# Patient Record
Sex: Female | Born: 1965 | Race: White | Hispanic: No | Marital: Married | Smoking: Former smoker
Health system: Southern US, Community
[De-identification: ages and names within clinical notes are randomized; demographics above are authoritative.]

## PROBLEM LIST (undated history)

## (undated) DIAGNOSIS — Z87442 Personal history of urinary calculi: Secondary | ICD-10-CM

## (undated) DIAGNOSIS — R519 Headache, unspecified: Secondary | ICD-10-CM

## (undated) DIAGNOSIS — N200 Calculus of kidney: Secondary | ICD-10-CM

## (undated) DIAGNOSIS — IMO0002 Reserved for concepts with insufficient information to code with codable children: Secondary | ICD-10-CM

## (undated) DIAGNOSIS — R51 Headache: Secondary | ICD-10-CM

## (undated) HISTORY — DX: Reserved for concepts with insufficient information to code with codable children: IMO0002

## (undated) HISTORY — DX: Personal history of urinary calculi: Z87.442

---

## 2003-02-09 DIAGNOSIS — IMO0002 Reserved for concepts with insufficient information to code with codable children: Secondary | ICD-10-CM

## 2003-02-09 DIAGNOSIS — R87619 Unspecified abnormal cytological findings in specimens from cervix uteri: Secondary | ICD-10-CM

## 2003-02-09 HISTORY — DX: Unspecified abnormal cytological findings in specimens from cervix uteri: R87.619

## 2003-02-09 HISTORY — DX: Reserved for concepts with insufficient information to code with codable children: IMO0002

## 2006-07-18 ENCOUNTER — Other Ambulatory Visit: Admission: RE | Admit: 2006-07-18 | Discharge: 2006-07-18 | Payer: Self-pay | Admitting: Obstetrics and Gynecology

## 2007-07-20 ENCOUNTER — Other Ambulatory Visit: Admission: RE | Admit: 2007-07-20 | Discharge: 2007-07-20 | Payer: Self-pay | Admitting: Obstetrics & Gynecology

## 2011-06-09 ENCOUNTER — Other Ambulatory Visit: Payer: Self-pay

## 2011-06-09 ENCOUNTER — Ambulatory Visit
Admission: RE | Admit: 2011-06-09 | Discharge: 2011-06-09 | Disposition: A | Discharge status: Home / Self Care | Payer: BC Managed Care – PPO | Source: Ambulatory Visit | Attending: Obstetrics & Gynecology | Admitting: Obstetrics & Gynecology

## 2011-06-09 ENCOUNTER — Other Ambulatory Visit: Payer: Self-pay | Admitting: Obstetrics & Gynecology

## 2011-06-09 DIAGNOSIS — R1032 Left lower quadrant pain: Secondary | ICD-10-CM

## 2011-06-09 MED ORDER — IOHEXOL 300 MG/ML  SOLN
100.0000 mL | Freq: Once | INTRAMUSCULAR | Status: AC | PRN
  Administered 2011-06-09: 100 mL via INTRAVENOUS

## 2012-08-02 ENCOUNTER — Other Ambulatory Visit: Payer: Self-pay | Admitting: Certified Nurse Midwife

## 2012-08-02 NOTE — Telephone Encounter (Signed)
Pt scheduled appt for 08/10/12.

## 2012-08-02 NOTE — Telephone Encounter (Signed)
eScribe request for refill on LORYNA Last filled - 08/10/11 X 1 YEAR Last AEX - 08/10/11 Next AEX - not scheduled.  Pt needs AEX. Message left for pt to return call.  1 month supply sent to pharmacy.

## 2012-08-04 ENCOUNTER — Encounter: Payer: Self-pay | Admitting: Certified Nurse Midwife

## 2012-08-10 ENCOUNTER — Encounter: Payer: Self-pay | Admitting: Certified Nurse Midwife

## 2012-08-10 ENCOUNTER — Ambulatory Visit: Payer: Self-pay | Admitting: Certified Nurse Midwife

## 2012-08-10 ENCOUNTER — Ambulatory Visit (INDEPENDENT_AMBULATORY_CARE_PROVIDER_SITE_OTHER): Payer: BC Managed Care – HMO | Admitting: Certified Nurse Midwife

## 2012-08-10 VITALS — BP 114/68 | HR 72 | Resp 18 | Ht 61.0 in | Wt 137.0 lb

## 2012-08-10 DIAGNOSIS — Z309 Encounter for contraceptive management, unspecified: Secondary | ICD-10-CM

## 2012-08-10 DIAGNOSIS — Z0000 Encounter for general adult medical examination without abnormal findings: Secondary | ICD-10-CM

## 2012-08-10 DIAGNOSIS — Z01419 Encounter for gynecological examination (general) (routine) without abnormal findings: Secondary | ICD-10-CM

## 2012-08-10 DIAGNOSIS — IMO0001 Reserved for inherently not codable concepts without codable children: Secondary | ICD-10-CM

## 2012-08-10 LAB — POCT URINALYSIS DIPSTICK: pH, UA: 5

## 2012-08-10 LAB — HEMOGLOBIN, FINGERSTICK: Hemoglobin, fingerstick: 12.5 g/dL (ref 12.0–16.0)

## 2012-08-10 MED ORDER — DROSPIRENONE-ETHINYL ESTRADIOL 3-0.02 MG PO TABS: 1.0000 | ORAL_TABLET | Freq: Every day | ORAL | Status: DC

## 2012-08-10 NOTE — Patient Instructions (Signed)

## 2012-08-10 NOTE — Progress Notes (Addendum)
47 y.o. G2P2002 married Caucasian Fe here for annual exam. Periods normal, no issues. On OCP for cycle control working well. No health issues today. No more problems with kidney stones. Enjoying being married!!  Patient's last menstrual period was 07/15/2012.          Sexually active: yes  The current method of family planning is OCP (estrogen/progesterone).    Exercising: no   Smoker:  no  Health Maintenance: Pap:  08/10/11 Normal  MMG:  04/06/12 Colonoscopy:  none BMD:   None TDaP:  Not up to date Labs: Hgb: 12.5    Urine: Leuks Trace   reports that she has never smoked. She does not have any smokeless tobacco history on file. She reports that she does not drink alcohol or use illicit drugs.  Past Medical History  Diagnosis Date  . History of kidney stones     10 years with uti's  . 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 Cesarean section  x2    Current Outpatient Prescriptions  Medication Sig Dispense Refill  . Ascorbic Acid (VITAMIN C PO) Take by mouth.      . drospirenone-ethinyl estradiol (LORYNA) 3-0.02 MG tablet TAKE 1 TABLET EVERY DAY  28 tablet  0  . IBUPROFEN PO Take by mouth as needed.      Marland Kitchen MAGNESIUM PO Take by mouth.       No current facility-administered medications for this visit.    Family History  Problem Relation Age of Onset  . Diabetes Father 56    ROS:  Pertinent items are noted in HPI.  Otherwise, a comprehensive ROS was negative.  Exam:   BP 114/68  Pulse 72  Resp 18  Ht 5\' 1"  (1.549 m)  Wt 137 lb (62.143 kg)  BMI 25.9 kg/m2  LMP 07/15/2012 Height: 5\' 1"  (154.9 cm)  Ht Readings from Last 3 Encounters:  08/10/12 5\' 1"  (1.549 m)    General appearance: alert, cooperative and appears stated age Head: Normocephalic, without obvious abnormality, atraumatic Neck: no adenopathy, supple, symmetrical, trachea midline and thyroid normal to inspection and palpation Lungs: clear to auscultation  bilaterally Breasts: normal appearance, no masses or tenderness, No nipple retraction or dimpling, No nipple discharge or bleeding, No axillary or supraclavicular adenopathy Heart: regular rate and rhythm Abdomen: soft, non-tender; no masses,  no organomegaly Extremities: extremities normal, atraumatic, no cyanosis or edema Skin: Skin color, texture, turgor normal. No rashes or lesions Lymph nodes: Cervical, supraclavicular, and axillary nodes normal. No abnormal inguinal nodes palpated Neurologic: Grossly normal   Pelvic: External genitalia:  no lesions              Urethra:  normal appearing urethra with no masses, tenderness or lesions              Bartholin's and Skene's: normal                 Vagina: normal appearing vagina with normal color and discharge, no lesions              Cervix: normal, non tender              Pap taken: no Bimanual Exam:  Uterus:  normal size, contour, position, consistency, mobility, non-tender and anteflexed              Adnexa: normal adnexa and no mass, fullness, tenderness  Rectovaginal: Confirms               Anus:  normal sphincter tone, no lesions  A:  Well Woman with normal exam  Contraception: OCP  History of Kidney stones  History of abnormal pap 2005, per record CIN1  Follow up pap negative  P:   Reviewed health and wellness pertinent to exam  Rx Yaz see order   Pap smear as per guidelines   Mammogram yearly pap smear not taken today  counseled on breast self exam, mammography screening, use and side effects of OCP's, adequate intake of calcium and vitamin D, diet and exercise  return annually or prn  An After Visit Summary was printed and given to the patient.   Review of this patient's previous records shows a history of CIN 1 - no h/o HGSIL.  CPRomine MD

## 2012-08-13 NOTE — Progress Notes (Signed)
CIN 2 in 2005, so I think she needs a pap.

## 2012-08-18 ENCOUNTER — Encounter: Payer: Self-pay | Admitting: Certified Nurse Midwife

## 2012-08-18 NOTE — Progress Notes (Signed)
Deb -  Did you get this pt to come back and get a pap?

## 2013-08-11 ENCOUNTER — Other Ambulatory Visit: Payer: Self-pay | Admitting: Certified Nurse Midwife

## 2013-08-13 NOTE — Telephone Encounter (Signed)
Last AEX 08/10/12 #1/12 refills was sent to pharmacy AEX scheduled for 08/10/13 with Ms. Carolynn Sayers #28/0 refills sent to pharmacy to last patient until AEX

## 2013-08-14 ENCOUNTER — Ambulatory Visit (INDEPENDENT_AMBULATORY_CARE_PROVIDER_SITE_OTHER): Payer: BC Managed Care – HMO | Admitting: Certified Nurse Midwife

## 2013-08-14 ENCOUNTER — Encounter: Payer: Self-pay | Admitting: Certified Nurse Midwife

## 2013-08-14 VITALS — BP 104/60 | HR 64 | Resp 16 | Ht 59.75 in | Wt 139.0 lb

## 2013-08-14 DIAGNOSIS — Z01419 Encounter for gynecological examination (general) (routine) without abnormal findings: Secondary | ICD-10-CM

## 2013-08-14 DIAGNOSIS — Z124 Encounter for screening for malignant neoplasm of cervix: Secondary | ICD-10-CM

## 2013-08-14 DIAGNOSIS — Z309 Encounter for contraceptive management, unspecified: Secondary | ICD-10-CM

## 2013-08-14 MED ORDER — DROSPIRENONE-ETHINYL ESTRADIOL 3-0.02 MG PO TABS: ORAL_TABLET | ORAL | Status: DC

## 2013-08-14 NOTE — Progress Notes (Signed)
48 y.o. G32P2002 Married Caucasian Fe here for annual exam.  Periods normal, 4-5 duration, moderate to light flow, no spotting, no issues with. [place] working well. Has labs at work, all normal per patient. Sees PCP prn problems only. Uneventful year! No health issues today.  Patient's last menstrual period was 08/11/2013.          Sexually active: Yes.    The current method of family planning is OCP (estrogen/progesterone).    Exercising: Yes.    walking Smoker:  no  Health Maintenance: Pap: 08-10-11 neg MMG:  04-06-12 normal per patient records requested Colonoscopy:  none BMD:   none TDaP:  Not sure, pt declines to update Labs: none Self breast exam: done occ   reports that she has quit smoking. She does not have any smokeless tobacco history on file. She reports that she drinks about .5 ounces of alcohol per week. She reports that she does not use illicit drugs.  Past Medical History  Diagnosis Date  . History of kidney stones     10 years with uti's  . Abnormal Pap smear 2005    CIN 1 per records    Past Surgical History  Procedure Laterality Date  . Cesarean section  x2    Current Outpatient Prescriptions  Medication Sig Dispense Refill  . HYDROcodone-acetaminophen (NORCO/VICODIN) 5-325 MG per tablet as needed.      . IBUPROFEN PO Take by mouth as needed.      Marland Kitchen LORYNA 3-0.02 MG tablet TAKE 1 TABLET BY MOUTH DAILY.  28 tablet  0  . ondansetron (ZOFRAN) 4 MG tablet as needed.      . tamsulosin (FLOMAX) 0.4 MG CAPS capsule Take as directed for 10 days       No current facility-administered medications for this visit.    Family History  Problem Relation Age of Onset  . Diabetes Father 40    ROS:  Pertinent items are noted in HPI.  Otherwise, a comprehensive ROS was negative.  Exam:   BP 104/60  Pulse 64  Resp 16  Ht 4' 11.75" (1.518 m)  Wt 139 lb (63.05 kg)  BMI 27.36 kg/m2  LMP 08/11/2013 Height: 4' 11.75" (151.8 cm)  Ht Readings from Last 3 Encounters:   08/14/13 4' 11.75" (1.518 m)  08/10/12 5\' 1"  (1.549 m)    General appearance: alert, cooperative and appears stated age Head: Normocephalic, without obvious abnormality, atraumatic Neck: no adenopathy, supple, symmetrical, trachea midline and thyroid normal to inspection and palpation and non-palpable Lungs: clear to auscultation bilaterally Breasts: normal appearance, no masses or tenderness, No nipple retraction or dimpling, No nipple discharge or bleeding, No axillary or supraclavicular adenopathy Heart: regular rate and rhythm Abdomen: soft, non-tender; no masses,  no organomegaly Extremities: extremities normal, atraumatic, no cyanosis or edema Skin: Skin color, texture, turgor normal. No rashes or lesions Lymph nodes: Cervical, supraclavicular, and axillary nodes normal. No abnormal inguinal nodes palpated Neurologic: Grossly normal   Pelvic: External genitalia:  no lesions              Urethra:  normal appearing urethra with no masses, tenderness or lesions              Bartholin's and Skene's: normal                 Vagina: normal appearing vagina with normal color and discharge, no lesions              Cervix: normal, non tender  Pap taken: Yes.   Bimanual Exam:  Uterus:  normal size, contour, position, consistency, mobility, non-tender and anteverted              Adnexa: normal adnexa and no mass, fullness, tenderness               Rectovaginal: Confirms               Anus:  normal sphincter tone, no lesions  A:  Well Woman with normal exam  Contraception OCP desired  Immunization up date TDAP  P:   Reviewed health and wellness pertinent to exam  Rx Yaz see order  Declines TDAP aware of benefits  Mammogram due stressed importance  Pap smear taken today with HPVHR   counseled on breast self exam, mammography  screening, use and side effects of OCP's, adequate intake of calcium and vitamin D, diet and exercise  return annually or prn  An After Visit  Summary was printed and given to the patient.

## 2013-08-14 NOTE — Patient Instructions (Signed)

## 2013-08-17 LAB — IPS PAP TEST WITH HPV

## 2013-08-17 NOTE — Progress Notes (Signed)
Reviewed personally.  M. Suzanne Kairi Harshbarger, MD.  

## 2013-12-10 ENCOUNTER — Encounter: Payer: Self-pay | Admitting: Certified Nurse Midwife

## 2014-07-25 ENCOUNTER — Other Ambulatory Visit: Payer: Self-pay | Admitting: Urology

## 2014-08-06 ENCOUNTER — Encounter (HOSPITAL_COMMUNITY): Payer: Self-pay | Admitting: General Practice

## 2014-08-09 HISTORY — PX: LITHOTRIPSY: SUR834

## 2014-08-15 ENCOUNTER — Ambulatory Visit (HOSPITAL_COMMUNITY): Payer: BLUE CROSS/BLUE SHIELD

## 2014-08-15 ENCOUNTER — Encounter (HOSPITAL_COMMUNITY)
Admission: RE | Disposition: A | Discharge status: Home / Self Care | Payer: Self-pay | Source: Ambulatory Visit | Attending: Urology

## 2014-08-15 ENCOUNTER — Ambulatory Visit (HOSPITAL_COMMUNITY)
Admission: RE | Admit: 2014-08-15 | Discharge: 2014-08-15 | Disposition: A | Discharge status: Home / Self Care | Payer: BLUE CROSS/BLUE SHIELD | Source: Ambulatory Visit | Attending: Urology | Admitting: Urology

## 2014-08-15 ENCOUNTER — Encounter (HOSPITAL_COMMUNITY): Payer: Self-pay | Admitting: General Practice

## 2014-08-15 DIAGNOSIS — N200 Calculus of kidney: Secondary | ICD-10-CM | POA: Insufficient documentation

## 2014-08-15 DIAGNOSIS — Z79891 Long term (current) use of opiate analgesic: Secondary | ICD-10-CM | POA: Insufficient documentation

## 2014-08-15 DIAGNOSIS — Z87442 Personal history of urinary calculi: Secondary | ICD-10-CM | POA: Diagnosis not present

## 2014-08-15 DIAGNOSIS — N201 Calculus of ureter: Secondary | ICD-10-CM

## 2014-08-15 DIAGNOSIS — Z793 Long term (current) use of hormonal contraceptives: Secondary | ICD-10-CM | POA: Insufficient documentation

## 2014-08-15 DIAGNOSIS — R109 Unspecified abdominal pain: Secondary | ICD-10-CM | POA: Diagnosis present

## 2014-08-15 DIAGNOSIS — Z87891 Personal history of nicotine dependence: Secondary | ICD-10-CM | POA: Diagnosis not present

## 2014-08-15 DIAGNOSIS — Z79899 Other long term (current) drug therapy: Secondary | ICD-10-CM | POA: Insufficient documentation

## 2014-08-15 DIAGNOSIS — Z841 Family history of disorders of kidney and ureter: Secondary | ICD-10-CM | POA: Diagnosis not present

## 2014-08-15 HISTORY — DX: Calculus of kidney: N20.0

## 2014-08-15 LAB — PREGNANCY, URINE: Preg Test, Ur: NEGATIVE

## 2014-08-15 SURGERY — LITHOTRIPSY, ESWL
Anesthesia: LOCAL | Laterality: Right

## 2014-08-15 MED ORDER — SODIUM CHLORIDE 0.9 % IV SOLN
INTRAVENOUS | Status: DC
  Administered 2014-08-15: 07:00:00 via INTRAVENOUS

## 2014-08-15 MED ORDER — TAMSULOSIN HCL 0.4 MG PO CAPS: 0.4000 mg | ORAL_CAPSULE | ORAL | Status: DC

## 2014-08-15 MED ORDER — OXYCODONE HCL 5 MG PO TABS
10.0000 mg | ORAL_TABLET | ORAL | Status: DC | PRN
  Administered 2014-08-15: 10 mg via ORAL
  Filled 2014-08-15: qty 2

## 2014-08-15 MED ORDER — OXYCODONE HCL 10 MG PO TABS: 10.0000 mg | ORAL_TABLET | ORAL | Status: DC | PRN

## 2014-08-15 MED ORDER — DIAZEPAM 5 MG PO TABS
10.0000 mg | ORAL_TABLET | ORAL | Status: AC
  Administered 2014-08-15: 10 mg via ORAL
  Filled 2014-08-15: qty 2

## 2014-08-15 MED ORDER — DIPHENHYDRAMINE HCL 25 MG PO CAPS
25.0000 mg | ORAL_CAPSULE | ORAL | Status: AC
  Administered 2014-08-15: 25 mg via ORAL
  Filled 2014-08-15: qty 1

## 2014-08-15 MED ORDER — CIPROFLOXACIN HCL 500 MG PO TABS
500.0000 mg | ORAL_TABLET | ORAL | Status: AC
  Administered 2014-08-15: 500 mg via ORAL
  Filled 2014-08-15: qty 1

## 2014-08-15 MED ORDER — TAMSULOSIN HCL 0.4 MG PO CAPS
0.4000 mg | ORAL_CAPSULE | Freq: Once | ORAL | Status: AC
  Administered 2014-08-15: 0.4 mg via ORAL
  Filled 2014-08-15: qty 1

## 2014-08-15 NOTE — Discharge Instructions (Signed)

## 2014-08-15 NOTE — H&P (Signed)
  s Klatt had an egg-shaped renal pelvic stone with likely a ball-valve effect with intermittent right flank pain. She underwent lithotripsy about 2 weeks ago. She reports that off and on she can have right flank pain but more right lower quadrant pain. She can get a little bit of nausea. It can be quite significant at times. It comes and goes and is rather intermittent. She is voiding more frequently than usual but has no other symptoms of cystitis.    When I saw Ms Claar on 07/02/2014 I thought she may have a distal residual fragment. I gave her Rapaflo, Vicodin, Phenergan, and a strainer. She was here with a followup KUB.   Review of systems: No change in bowel or neurologic systems.  She still had microscopic hematuria on 07/02/2014.   On microscopy Ms Vanengen still has microscopic hematuria.    Past Medical History Problems  1. Former smoker [phone]) 2. History of kidney stones (H84.696)  Surgical History Problems  1. History of Cesarean Section  Current Meds 1. Birth Control Pill;  Therapy: (Recorded:13May2016) to Recorded 2. Hydrocodone-Acetaminophen 5-325 MG Oral Tablet; Take 1 tablet every 4 - 6 hours as  needed;  Therapy: [id number] to (Evaluate:03Jun2016); Last Rx:24May2016 Ordered 3. Promethazine HCl - 25 MG Oral Tablet; TAKE 1 TABLET EVERY 4 TO 6 HOURS AS  NEEDED FOR NAUSEA;  Therapy: [id number] to (Evaluate:29May2016); Last Rx:24May2016 Ordered  Allergies Medication  1. PredniSONE TABS 2. Zithromax Z-Pak TABS  Family History Problems  1. Family history of Diabetes Mellitus : Father 2. Family history of Diabetes Mellitus : Paternal Aunt 3. Family history of Nephrolithiasis : Mother 4. Family history of Nephrolithiasis : Brother  Social History Problems  1. Alcohol Use 2. Caffeine Use 3. Former smoker [phone] 4. Marital History - Currently Married 5. Occupation:   Fish farm manager   Review of Systems Constitutional, skin, eye, otolaryngeal,  hematologic/lymphatic, cardiovascular, pulmonary, endocrine, gastrointestinal, neurological and psychiatric system(s) were reviewed and pertinent findings if present are noted.  Genitourinary: nocturia.  Musculoskeletal: back pain.    Vitals Vital Signs Blood Pressure: 124 / 71 Temperature: 98.5 F Heart Rate: 90   Physical Exam Constitutional: Well nourished and well developed. No acute distress.  ENT:. The ears and nose are normal in appearance.  Neck: The appearance of the neck is normal and no neck mass is present.  Pulmonary: No respiratory distress and normal respiratory rhythm and effort.  Cardiovascular: Heart rate and rhythm are normal. No peripheral edema.  Abdomen: The abdomen is soft and nontender. No masses are palpated. No CVA tenderness. No hernias are palpable. No hepatosplenomegaly noted.  Lymphatics: The femoral and inguinal nodes are not enlarged or tender.  Skin: Normal skin turgor, no visible rash and no visible skin lesions.  Neuro/Psych:. Mood and affect are appropriate.     We talked about ESWL in detail. Pros, cons, general surgical and anesthetic risks, and other options including watchful waiting and ureteroscopy were discussed. Success and failure rates and need for further/repeat therapy were discussed. Risks were described but not limited to pain, infection, sepsis, and bleeding. The risk of renal and ureteral trauma with short- and long-term sequelae was discussed. The risk of injury to adjacent structures was discussed. The risk of needing a stent post-ESWL was discussed.

## 2014-08-15 NOTE — Op Note (Signed)
See [facility] OP note scanned into chart. Also because of the size, density, location and other factors that cannot be anticipated I feel this will likely be a staged procedure. This fact supersedes any indication in the scanned [facility] operative note to the contrary.  

## 2014-08-20 ENCOUNTER — Telehealth: Payer: Self-pay | Admitting: Certified Nurse Midwife

## 2014-08-20 ENCOUNTER — Ambulatory Visit: Payer: BC Managed Care – HMO | Admitting: Certified Nurse Midwife

## 2014-08-20 NOTE — Telephone Encounter (Signed)
Patient canceled her appointment for today. She had a procedure done for her kidney on Thursday and is still not feeling well.

## 2014-09-07 ENCOUNTER — Other Ambulatory Visit: Payer: Self-pay | Admitting: Certified Nurse Midwife

## 2014-09-09 NOTE — Telephone Encounter (Signed)
Medication refill request: Loryna Last AEX:  08/14/13 with DL Next AEX: 11/08/14 with DL  Last MMG (if hormonal medication request): none Refill authorized: #1/1 rfs  (Routed to Ms. Patty since Ms. Jackelyn Poling is out of the office today).

## 2014-11-08 ENCOUNTER — Encounter: Payer: Self-pay | Admitting: Certified Nurse Midwife

## 2014-11-08 ENCOUNTER — Ambulatory Visit (INDEPENDENT_AMBULATORY_CARE_PROVIDER_SITE_OTHER): Payer: BLUE CROSS/BLUE SHIELD | Admitting: Certified Nurse Midwife

## 2014-11-08 VITALS — BP 120/70 | HR 68 | Resp 16 | Ht 61.0 in | Wt 141.0 lb

## 2014-11-08 DIAGNOSIS — N9489 Other specified conditions associated with female genital organs and menstrual cycle: Secondary | ICD-10-CM

## 2014-11-08 DIAGNOSIS — Z01419 Encounter for gynecological examination (general) (routine) without abnormal findings: Secondary | ICD-10-CM

## 2014-11-08 DIAGNOSIS — N949 Unspecified condition associated with female genital organs and menstrual cycle: Secondary | ICD-10-CM

## 2014-11-08 DIAGNOSIS — N852 Hypertrophy of uterus: Secondary | ICD-10-CM

## 2014-11-08 NOTE — Patient Instructions (Signed)
EXERCISE AND DIET:  We recommended that you start or continue a regular exercise program for good health. Regular exercise means any activity that makes your heart beat faster and makes you sweat.  We recommend exercising at least 30 minutes per day at least 3 days a week, preferably 4 or 5.  We also recommend a diet low in fat and sugar.  Inactivity, poor dietary choices and obesity can cause diabetes, heart attack, stroke, and kidney damage, among others.    ALCOHOL AND SMOKING:  Women should limit their alcohol intake to no more than 7 drinks/beers/glasses of wine (combined, not each!) per week. Moderation of alcohol intake to this level decreases your risk of breast cancer and liver damage. And of course, no recreational drugs are part of a healthy lifestyle.  And absolutely no smoking or even second hand smoke. Most people know smoking can cause heart and lung diseases, but did you know it also contributes to weakening of your bones? Aging of your skin?  Yellowing of your teeth and nails?  CALCIUM AND VITAMIN D:  Adequate intake of calcium and Vitamin D are recommended.  The recommendations for exact amounts of these supplements seem to change often, but generally speaking 600 mg of calcium (either carbonate or citrate) and 800 units of Vitamin D per day seems prudent. Certain women may benefit from higher intake of Vitamin D.  If you are among these women, your doctor will have told you during your visit.    PAP SMEARS:  Pap smears, to check for cervical cancer or precancers,  have traditionally been done yearly, although recent scientific advances have shown that most women can have pap smears less often.  However, every woman still should have a physical exam from her gynecologist every year. It will include a breast check, inspection of the vulva and vagina to check for abnormal growths or skin changes, a visual exam of the cervix, and then an exam to evaluate the size and shape of the uterus and  ovaries.  And after 47 years of age, a rectal exam is indicated to check for rectal cancers. We will also provide age appropriate advice regarding health maintenance, like when you should have certain vaccines, screening for sexually transmitted diseases, bone density testing, colonoscopy, mammograms, etc.   MAMMOGRAMS:  All women over 40 years old should have a yearly mammogram. Many facilities now offer a "3D" mammogram, which may cost around $50 extra out of pocket. If possible,  we recommend you accept the option to have the 3D mammogram performed.  It both reduces the number of women who will be called back for extra views which then turn out to be normal, and it is better than the routine mammogram at detecting truly abnormal areas.    COLONOSCOPY:  Colonoscopy to screen for colon cancer is recommended for all women at age 50.  We know, you hate the idea of the prep.  We agree, BUT, having colon cancer and not knowing it is worse!!  Colon cancer so often starts as a polyp that can be seen and removed at colonscopy, which can quite literally save your life!  And if your first colonoscopy is normal and you have no family history of colon cancer, most women don't have to have it again for 10 years.  Once every ten years, you can do something that may end up saving your life, right?  We will be happy to help you get it scheduled when you are ready.    Be sure to check your insurance coverage so you understand how much it will cost.  It may be covered as a preventative service at no cost, but you should check your particular policy.     Fibroids Fibroids are lumps (tumors) that can occur any place in a woman's body. These lumps are not cancerous. Fibroids vary in size, weight, and where they grow. HOME CARE  Do not take aspirin.  Write down the number of pads or tampons you use during your period. Tell your doctor. This can help determine the best treatment for you. GET HELP RIGHT AWAY IF:  You have  pain in your lower belly (abdomen) that is not helped with medicine.  You have cramps that are not helped with medicine.  You have more bleeding between or during your period.  You feel lightheaded or pass out (faint).  Your lower belly pain gets worse. MAKE SURE YOU:  Understand these instructions.  Will watch your condition.  Will get help right away if you are not doing well or get worse. Document Released: 02/27/2010 Document Revised: 04/19/2011 Document Reviewed: 02/27/2010 [facility] Patient Information 2015 [place], [place]. This information is not intended to replace advice given to you by your health care provider. Make sure you discuss any questions you have with your health care provider.

## 2014-11-08 NOTE — Progress Notes (Signed)
Encounter reviewed by Dr. Brook Amundson C. Silva.  

## 2014-11-08 NOTE — Progress Notes (Addendum)
49 y.o. G5P2002 Married  Caucasian Fe here for annual exam. Periods are normal, no issues. 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 Recent lithotripsy for kidney stone, which has resolved. Sees urgent care if needed. Now a grandmother with first grandson! Has stopped OCP to see if helps with libido. Not worried about contraception. No other health issues today.  Patient's last menstrual period was 10/29/2014.          Sexually active: Yes.    The current method of family planning is OCP (estrogen/progesterone).    Exercising: Yes.    walking Smoker:  no  Health Maintenance: Pap:  08-14-13 neg HPV HR neg MMG:  04-06-12 neg Colonoscopy:  none BMD:   none TDaP:  2016 Labs: none Self breast exam: done occ   reports that she has quit smoking. She does not have any smokeless tobacco history on file. She reports that she drinks about 1.2 oz of alcohol per week. She reports that she does not use illicit drugs.  Past Medical History  Diagnosis Date  . History of kidney stones     10 years with uti's  . Abnormal Pap smear 2005    CIN 1 per records  . Kidney stones     Past Surgical History  Procedure Laterality Date  . Cesarean section  x2    Current Outpatient Prescriptions  Medication Sig Dispense Refill  . cyclobenzaprine (FLEXERIL) 5 MG tablet Take 5 mg by mouth.    Marland Kitchen HYDROcodone-acetaminophen (NORCO/VICODIN) 5-325 MG per tablet 1-2 tablets every 4 (four) hours as needed for moderate pain.     Marland Kitchen LORYNA 3-0.02 MG tablet TAKE 1 TABLET BY MOUTH DAILY. 1 Package 1  . Multiple Vitamin (MULTIVITAMIN WITH MINERALS) TABS tablet Take 1 tablet by mouth every evening.     No current facility-administered medications for this visit.    Family History  Problem Relation Age of Onset  . Diabetes Father 31    ROS:  Pertinent items are noted in HPI.  Otherwise, a comprehensive ROS was negative.  Exam:   BP 120/70 mmHg  Pulse 68  Resp 16  Ht 5\' 1"  (1.549 m)   Wt 141 lb (63.957 kg)  BMI 26.66 kg/m2  LMP 10/29/2014 Height: 5\' 1"  (154.9 cm) Ht Readings from Last 3 Encounters:  11/08/14 5\' 1"  (1.549 m)  08/15/14 5\' 1"  (1.549 m)  08/14/13 4' 11.75" (1.518 m)    General appearance: alert, cooperative and appears stated age Head: Normocephalic, without obvious abnormality, atraumatic Neck: no adenopathy, supple, symmetrical, trachea midline and thyroid normal to inspection and palpation Lungs: clear to auscultation bilaterally Breasts: normal appearance, no masses or tenderness, No nipple retraction or dimpling, No nipple discharge or bleeding, No axillary or supraclavicular adenopathy Heart: regular rate and rhythm Abdomen: soft, non-tender; no masses,  no organomegaly Extremities: extremities normal, atraumatic, no cyanosis or edema Skin: Skin color, texture, turgor normal. No rashes or lesions Lymph nodes: Cervical, supraclavicular, and axillary nodes normal. No abnormal inguinal nodes palpated Neurologic: Grossly normal   Pelvic: External genitalia:  no lesions              Urethra:  normal appearing urethra with no masses, tenderness or lesions              Bartholin's and Skene's: normal                 Vagina: normal appearing vagina with normal color and discharge, no lesions  Cervix: normal,non tender, no lesions              Pap taken: No. Bimanual Exam:  Uterus:  enlarged, 10-12 week size tilts left, normal feel weeks size              Adnexa: normal adnexa and no mass, fullness, tenderness               Rectovaginal: Confirms               Anus:  normal sphincter tone, no lesions  Chaperone present: yes  A:  Well Woman with normal exam  Contraception none desired  Enlarged uterus vs adnexal mass on left  Recent lithotripsy for kidney stone, resolving    P:   Reviewed health and wellness pertinent to exam  Will advise if she desires OCP again, no RX given  Discussed finding and need to evaluate with PUS, patient  agreeable and is aware she will be called with insurance info and scheduled. Voiced understanding.questions addressed regarding findings.  Pap smear as above not taken  counseled on breast self exam, mammography screening, adequate intake of calcium and vitamin D, diet and exercise  return annually or prn  An After Visit Summary was printed and given to the patient.

## 2014-11-12 ENCOUNTER — Telehealth: Payer: Self-pay | Admitting: Obstetrics & Gynecology

## 2014-11-12 NOTE — Telephone Encounter (Signed)
Spoke with patient and reviewed benefits for pelvic ultrasound. Patient understood and agreeable. Patient agreeable to schedule with Dr Sabra Heck on 11/14/14. Patient agreeable to 72 hour cancellation policy with $545 fee. Due to scheduling within this window, patient aware she may call today only to cancel/reschedule to avoid fee. Verified arrival date/time. Patient agreeable. Ok to close.

## 2014-11-14 ENCOUNTER — Ambulatory Visit (INDEPENDENT_AMBULATORY_CARE_PROVIDER_SITE_OTHER): Payer: BLUE CROSS/BLUE SHIELD

## 2014-11-14 ENCOUNTER — Ambulatory Visit (INDEPENDENT_AMBULATORY_CARE_PROVIDER_SITE_OTHER): Payer: BLUE CROSS/BLUE SHIELD | Admitting: Obstetrics & Gynecology

## 2014-11-14 VITALS — BP 122/82 | HR 72 | Resp 16 | Wt 144.0 lb

## 2014-11-14 DIAGNOSIS — D251 Intramural leiomyoma of uterus: Secondary | ICD-10-CM | POA: Diagnosis not present

## 2014-11-14 DIAGNOSIS — N949 Unspecified condition associated with female genital organs and menstrual cycle: Secondary | ICD-10-CM | POA: Diagnosis not present

## 2014-11-14 DIAGNOSIS — N9489 Other specified conditions associated with female genital organs and menstrual cycle: Secondary | ICD-10-CM

## 2014-11-14 DIAGNOSIS — N852 Hypertrophy of uterus: Secondary | ICD-10-CM | POA: Diagnosis not present

## 2014-11-14 MED ORDER — NORETHINDRONE 0.35 MG PO TABS: 1.0000 | ORAL_TABLET | Freq: Every day | ORAL | Status: DC

## 2014-11-14 NOTE — Progress Notes (Addendum)
49 y.o. Amy Curry here for a pelvic ultrasound due to enlarged uterus noted on physical exam done with CNM, Amy Curry, on 11/08/14.  Pt 's cycles are normal and she is on [place] for contraception.  Patient's last menstrual period was 10/29/2014.  Sexually active:  yes  Contraception:combination OCPs  FINDINGS: UTERUS: 11.1 x 5.5 x 4.8cm with 3.0 x 1.7 x 2.6cm intramural fibroid and 1.0 x 0.6cm intramural fibroid EMS: 2.75mm ADNEXA:   Left ovary 2.7 x 1.5 x 6.2EZ with 6.6QH follicle   Right ovary not clearly seen but no mass/cystic area notes.  Visualization attempted with both TV and TA approach CUL DE SAC: no free fluid  Incomplete bladder emptying noted on PUS.  Pt voided right before PUS and there was still moderate urine present.  Do not feel size of uterus or size of fibroids is likely to cause incomplete emptying due to location and size.  Pt does have follow up with Amy Curry due to nephrolithiasis and recent lithotripsy.  D/w pt finding with bladder and asked her to convey this to Amy Curry.  Copy of note will be sent.    Findings discussed with pt.  Prior ultrasound reviewed.  Finding of fibroids discussed.  ACOG bulletin on fibroids reviewed together.  Pt would like to try change in OCPs from combination to progesterone only to see if this will decrease size of fibroids/uterus as well as control her cycles.  Side effects of progesterone only OCPs reviewed with pt including DUB, moodiness.  Voiced understanding.  Assessment:  Fibroid uterus with two intramural fibroids Incomplete bladder emptying  Plan: Stop Loryna OCPs and start Micronor.  Pt will finish current pack and then start the micronor.  Will see if this will help to prevent fibroids/uterus from changing in size.  Follow up 4 months for recheck.  Repeat PUS if she has change in symptoms, bleeding in particular, or any change in physical exam.    Pt will follow up with Amy Curry, urology and  copy of note will be sent.   20 minutes spent with patient >50% of time was in face to face discussion of above.

## 2014-11-21 ENCOUNTER — Encounter: Payer: Self-pay | Admitting: Obstetrics & Gynecology

## 2014-11-21 DIAGNOSIS — D251 Intramural leiomyoma of uterus: Secondary | ICD-10-CM | POA: Insufficient documentation

## 2014-11-22 NOTE — Addendum Note (Signed)
Addended by: Regina Eck on: 11/22/2014 08:03 AM   Modules accepted: Miquel Dunn

## 2014-12-02 ENCOUNTER — Ambulatory Visit (INDEPENDENT_AMBULATORY_CARE_PROVIDER_SITE_OTHER): Payer: BLUE CROSS/BLUE SHIELD | Admitting: Obstetrics & Gynecology

## 2014-12-02 ENCOUNTER — Telehealth: Payer: Self-pay | Admitting: Obstetrics & Gynecology

## 2014-12-02 VITALS — BP 144/82 | HR 84 | Resp 20 | Wt 143.0 lb

## 2014-12-02 DIAGNOSIS — N200 Calculus of kidney: Secondary | ICD-10-CM

## 2014-12-02 DIAGNOSIS — R1032 Left lower quadrant pain: Secondary | ICD-10-CM

## 2014-12-02 LAB — POCT URINALYSIS DIPSTICK
BILIRUBIN UA: NEGATIVE
Glucose, UA: NEGATIVE
Ketones, UA: NEGATIVE
Leukocytes, UA: NEGATIVE
NITRITE UA: NEGATIVE
Protein, UA: NEGATIVE
UROBILINOGEN UA: NEGATIVE
pH, UA: 6

## 2014-12-02 MED ORDER — OXYCODONE-ACETAMINOPHEN 5-325 MG PO TABS: 1.0000 | ORAL_TABLET | Freq: Four times a day (QID) | ORAL | Status: DC | PRN

## 2014-12-02 NOTE — Progress Notes (Signed)
Subjective:     Patient ID: Amy Curry, female   DOB: Aug 06, 1965, 49 y.o.   MRN: [id number]  HPI 49 yo G2P2 MWF here for new onset of LLQ pain.  Pt reports cycle started 11/29/14 with heavier flow on Friday and Saturday.  She then started having lower left sided pain starting Saturday afternoon.  Pain is colicky but certain movements seem to make this worse.  Pain is eased off if she sits.  Heating pad while on her left side would help pain as well.  Pain seems to be worse if she's on her right side.  She had some "left over flexeril" and she took this last night.  It did nothing.  Ibuprofen helped mildly.    Denies diarrhea.  Reports she is having "normal bowel movements", about 1 a day.  This hasn't changed over the weekend.  Denies nausea.    Known hx of renal stones.  Pt reports pain is usually on her right side and not her left side.    Pt has PUS 11/14/14.  Two intramural fibroids measuring 3.0 and 1.0cm noted.  Left ovary was normal with a small follicle present.  Right ovary was not seen despite significant attempt to identify.  These were not seen in prior PUS done several years ago.  She as not started the micronor yet.    Review of Systems  Genitourinary: Positive for pelvic pain.       Objective:   Physical Exam  Constitutional: She appears well-developed and well-nourished.  Abdominal: Soft. Bowel sounds are normal. She exhibits no distension and no mass. There is no tenderness. There is no rebound and no guarding.  Genitourinary: Vagina normal. There is no rash, tenderness, lesion or injury on the right labia. There is no rash, tenderness, lesion or injury on the left labia.  Lymphadenopathy:       Right: No inguinal adenopathy present.       Left: No inguinal adenopathy present.  Skin: Skin is warm and dry.  Psychiatric: She has a normal mood and affect.   Brief bedside ultrasound performed.  No gross left adnexal abnormalities noted.    Assessment:     LLQ pain,  cannot illicit pain on physical exam today  H/o nephrolithiasis    Plan:     Pt has follow up with Dr. Matilde Sprang tomorrow.  Pt will call back after appt to give update.  Follow up with be scheduled pending this result.

## 2014-12-02 NOTE — Telephone Encounter (Signed)
Patient called and said, "I am having some really bad pain in my right side where my ovaries are. I want to speak with the nurse about whether it may be my fibroid or not."

## 2014-12-02 NOTE — Telephone Encounter (Signed)
Call to patient. She states that she started her cycle on 11/29/14 and began micronor that day. On Saturday, she developed new pain on the left lower pelvic area. Patient confirms pain is on Left side.  She describes the pain as sharp and shooting and sometimes relieved by rest. Has used heating pad and Excedrin and muscle relaxants  without relief. Denies heavy bleeding. At work today. Patient offered office visit and accepts, office visit with Dr. Sabra Heck today at 1430.  Routing to provider for final review. Patient agreeable to disposition. Will close encounter.

## 2014-12-03 ENCOUNTER — Telehealth: Payer: Self-pay | Admitting: Obstetrics & Gynecology

## 2014-12-03 NOTE — Telephone Encounter (Signed)
Patient had visit with Dr. Matilde Sprang from [facility], she said Dr. Sabra Heck wanted an update. Patient says that Dr. Matilde Sprang will be sending over records and that it would be better for Dr. Sabra Heck to see how she was doing from those. Best contact # for patient [phone]

## 2014-12-03 NOTE — Telephone Encounter (Signed)
Spoke with patient. Patient states that she was seen with Dr.MacDiarmid today and he was going to send [facility] notes of her appointment. She had an X-ray today which did not show a kidney stone. Is being treated like she has a stone to see if one passes that was not seen in the x-ray. If symptoms persist she will be seen again with Dr.MacDiarmid for a CT scan. Advised I will provide Dr.Miller with an update and check for OV notes from Dr.MacDiarmid for Dr.Miller's review.  Routing to provider for final review. Patient agreeable to disposition. Will close encounter.

## 2014-12-06 ENCOUNTER — Encounter: Payer: Self-pay | Admitting: Obstetrics & Gynecology

## 2014-12-06 DIAGNOSIS — N200 Calculus of kidney: Secondary | ICD-10-CM | POA: Insufficient documentation

## 2014-12-27 ENCOUNTER — Telehealth: Payer: Self-pay | Admitting: Obstetrics & Gynecology

## 2014-12-27 NOTE — Telephone Encounter (Signed)
Called pt and spoke with her personally.  She wants to stop her Camilla completely and not restart the combination OCPs as well.  Knows to use condoms for [facility].  She'd just like to see what her cycles are like off all hormones.  She will give an update in another month or if starts to have really have flow.  Knows she has fibroids and may have heavier flow.  Just doesn't like the hormonal side effects on the combination OCPs and is tired of the irregular bleeding on the micronor.    Asked pt about the pain she had at her last visit.  She saw her urologist, Dr. Matilde Sprang.  She was given flomax to help with possibly passing a stone.  The pain resolved in about two days.  She's feeling fine from this standpoint.    Ok to close encounter.

## 2014-12-27 NOTE — Telephone Encounter (Signed)
Patient has been prescribed a new birth control pill which she has been on for a month, she has had two cycles that last 7-10 days within a month. She just started her second pack she's been taking it everyday as instructed. Patient wanting to know Dr. Ammie Ferrier recommendations because she said she can't do this everyday. [phone]

## 2014-12-27 NOTE — Telephone Encounter (Addendum)
Spoke with patient. Patient states that she started taking Camila on the first day of her cycle this month. Her cycle lasted for 10 days. Then had 2 weeks off from bleeding and started again. Has now been bleeding for 7 days. Advised patient with new start on POP it can take 3-6 months for her body to adjust and irregular bleeding is not uncommon. Denies fatigue, weakness,  or dizziness. Encouraged patient to continue taking POP daily at the same time and not to miss any pills. Patient states "I can not do this for 3 more months. I feel like I am on a constant cycle. I have cramping and everything." Patient is requesting I speak with Dr.Miller about alternatives. Advised will speak with Dr.Miller and return call with further recommendations.

## 2015-03-18 ENCOUNTER — Ambulatory Visit: Payer: BLUE CROSS/BLUE SHIELD | Admitting: Obstetrics & Gynecology

## 2015-03-21 ENCOUNTER — Encounter: Payer: Self-pay | Admitting: Obstetrics & Gynecology

## 2015-03-21 ENCOUNTER — Ambulatory Visit (INDEPENDENT_AMBULATORY_CARE_PROVIDER_SITE_OTHER): Payer: BLUE CROSS/BLUE SHIELD | Admitting: Obstetrics & Gynecology

## 2015-03-21 VITALS — BP 110/66 | HR 90 | Resp 16 | Ht 61.0 in | Wt 149.0 lb

## 2015-03-21 DIAGNOSIS — D251 Intramural leiomyoma of uterus: Secondary | ICD-10-CM

## 2015-03-21 DIAGNOSIS — R1032 Left lower quadrant pain: Secondary | ICD-10-CM | POA: Diagnosis not present

## 2015-03-21 NOTE — Progress Notes (Signed)
Subjective:     Patient ID: Amy Curry, female   DOB: 08-Aug-1965, 50 y.o.   MRN: [id number]  HPI 50 yo G2P2 MWF here for follow-up of irregular and heavy cycles, enlarged uterus and known fibroid.  Seen in October for PUS showing enlarged uterus measuring 11 x 5.5 x 5cm with 3.0 and 1.0cm fibroids.  Pt was seen a couple of weeks later, she returned for complaint of LLQ pain.  She followed up with Dr. Matilde Sprang and he thought she may have had a kidney stone.  Was treated with Flomax.  Cycles are "fairly regular" occuring every 3-4 weeks with flow really heavy on day 1 and lasts, in total, 5-6 days.    Pt has not had the same pain she had in October.  Only once, in January, did she have a little twinge on the same side but this lasted just a few hours and resolved after she took a "muscle relaxer" she had left over from treatment for a muscle spasm in the past.  She's not really sure it did anything or if the pain just resolved on it's own.  For now, she is not interested in anything hormonal for treatment and I am in agreement.  Known hx of fibroids but unless bleeding changes, she just wants to "ride this out until menopause, if possible".    Review of Systems  All other systems reviewed and are negative.      Objective:   Physical Exam  Constitutional: She is oriented to person, place, and time. She appears well-developed and well-nourished.  Neurological: She is alert and oriented to person, place, and time.  Psychiatric: She has a normal mood and affect.   No other physical exam performed    Assessment:     H/o LLQ pain that has resolved Known hx of fibroids.  Stopped combination OCPs due to size of uterus and ultrasound findings. H/O DUB that seems to have resolved after stopping Micronor H/O nephrolithiasis No using any oral contraception    Plan:     Pt aware for need to use condoms or natural family planning for contraception For now, will just keep appt scheduled for  AEX 10/17 with Debbi Hollice Espy and call if she has any new changes/issues with bleeding.      ~15 minutes spent with patient >50% of time was in face to face discussion of above.

## 2015-11-11 ENCOUNTER — Ambulatory Visit (INDEPENDENT_AMBULATORY_CARE_PROVIDER_SITE_OTHER): Payer: BLUE CROSS/BLUE SHIELD | Admitting: Certified Nurse Midwife

## 2015-11-11 ENCOUNTER — Encounter: Payer: Self-pay | Admitting: Certified Nurse Midwife

## 2015-11-11 VITALS — BP 116/62 | HR 70 | Resp 16 | Ht 60.75 in | Wt 147.0 lb

## 2015-11-11 DIAGNOSIS — N852 Hypertrophy of uterus: Secondary | ICD-10-CM

## 2015-11-11 DIAGNOSIS — Z01419 Encounter for gynecological examination (general) (routine) without abnormal findings: Secondary | ICD-10-CM | POA: Diagnosis not present

## 2015-11-11 DIAGNOSIS — Z86018 Personal history of other benign neoplasm: Secondary | ICD-10-CM

## 2015-11-11 DIAGNOSIS — Z124 Encounter for screening for malignant neoplasm of cervix: Secondary | ICD-10-CM | POA: Diagnosis not present

## 2015-11-11 NOTE — Patient Instructions (Signed)

## 2015-11-11 NOTE — Progress Notes (Signed)
50 y.o. G30P2002 Married  Caucasian Fe here for annual exam. Periods are worse with increase clotting , 21 day cycle with 3-4 days duration with heavy day 1-2. Has had accidents. Cramping is severe on first day and then decreases. OTC does not help. History of fibroids, aware of heavy periods with fibroids. "Just tired of dealing with this". Contraception none in years. Declines labs today. No other health issues today. First grandchild a grandson!  Patient's last menstrual period was 11/08/2015 (exact date).          Sexually active: Yes.    The current method of family planning is none.  Exercising: No.  exercise Smoker:  no  Health Maintenance: Pap: 08-14-13 neg HPV HR neg MMG:  12-11-14 birads 2:neg Colonoscopy:  none BMD:   none TDaP:  2016 Shingles: no Pneumonia: no Hep C and HIV: not done Labs: none Self breast exam: done weekly   reports that she has quit smoking. She has never used smokeless tobacco. She reports that she drinks about 0.6 oz of alcohol per week . She reports that she does not use drugs.  Past Medical History:  Diagnosis Date  . 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 History of kidney stones    10 years with uti's  . Kidney stones     Past Surgical History:  Procedure Laterality Date  . CESAREAN SECTION  x2  . LITHOTRIPSY  7/16    Current Outpatient Prescriptions  Medication Sig Dispense Refill  . Multiple Vitamin (MULTIVITAMIN WITH MINERALS) TABS tablet Take 1 tablet by mouth every evening.     No current facility-administered medications for this visit.     Family History  Problem Relation Age of Onset  . Diabetes Father 66    ROS:  Pertinent items are noted in HPI.  Otherwise, a comprehensive ROS was negative.  Exam:   BP 116/62   Pulse 70   Resp 16   Ht 5' 0.75" (1.543 m)   Wt 147 lb (66.7 kg)   LMP 11/08/2015 (Exact Date)   BMI 28.00 kg/m  Height: 5' 0.75" (154.3 cm) Ht Readings from Last 3 Encounters:  11/11/15 5' 0.75"  (1.543 m)  03/21/15 5\' 1"  (1.549 m)  11/08/14 5\' 1"  (1.549 m)    General appearance: alert, cooperative and appears stated age Head: Normocephalic, without obvious abnormality, atraumatic Neck: no adenopathy, supple, symmetrical, trachea midline and thyroid normal to inspection and palpation Lungs: clear to auscultation bilaterally Breasts: normal appearance, no masses or tenderness, No nipple retraction or dimpling, No nipple discharge or bleeding, No axillary or supraclavicular adenopathy Heart: regular rate and rhythm Abdomen: soft, non-tender; no masses,  no organomegaly Extremities: extremities normal, atraumatic, no cyanosis or edema Skin: Skin color, texture, turgor normal. No rashes or lesions Lymph nodes: Cervical, supraclavicular, and axillary nodes normal. No abnormal inguinal nodes palpated Neurologic: Grossly normal   Pelvic: External genitalia:  no lesions              Urethra:  normal appearing urethra with no masses, tenderness or lesions              Bartholin's and Skene's: normal                 Vagina: normal appearing vagina with normal color and discharge, no lesions              Cervix: multiparous appearance, no cervical motion tenderness and no lesions  Pap taken: Yes.   Bimanual Exam:  Uterus:  enlarged, 12-14 week size, nodular tilts to left weeks size              Adnexa: normal adnexa and no mass, fullness, tenderness               Rectovaginal: Confirms               Anus:  normal sphincter tone, no lesions  Chaperone present: yes  A:  Well Woman with normal exam  Contraception none  Dysmenorrhea/menorrhagia with regular cycle  History of fibroids ? Size change  Screening labs needed declines  P:   Reviewed health and wellness pertinent to exam  Discussed fibroids and menstrual cycle changes with. Discussed options for management , medication, ? Ablation, hysterectomy. Patient is ready to move on for better life without periods.  Will  discuss options available to her with Dr. Sabra Heck and advise or set up consult. Patient agreeable  May need repeat PUS to evaluate size of uterus and fibroids. Patient agreeable if needed.  Pap smear as above with HPV reflex   counseled on breast self exam, mammography screening, adequate intake of calcium and vitamin D, diet and exercise  return annually or prn  An After Visit Summary was printed and given to the patient.

## 2015-11-12 LAB — IPS PAP TEST WITH REFLEX TO HPV

## 2015-11-12 NOTE — Progress Notes (Signed)
Encounter reviewed Aerilynn Goin, MD   

## 2015-11-14 ENCOUNTER — Telehealth: Payer: Self-pay | Admitting: Certified Nurse Midwife

## 2015-11-14 DIAGNOSIS — N852 Hypertrophy of uterus: Secondary | ICD-10-CM

## 2015-11-14 DIAGNOSIS — D251 Intramural leiomyoma of uterus: Secondary | ICD-10-CM

## 2015-11-14 NOTE — Telephone Encounter (Signed)
PUS would be good to comparison and then a consultation/exam with me.

## 2015-11-14 NOTE — Telephone Encounter (Signed)
Patient was seen this week and has some questions for DL.

## 2015-11-14 NOTE — Telephone Encounter (Signed)
Spoke with patient. Patient was seen on 10/3/207 for aex with Melvia Heaps CNM. Reports they discussed possible ablation versus hysterectomy. Patient has a history of fibroids and is having increased clotting and heavy bleeding with her menses. Patient states she has decided that she does not want an ablation and would like to have a hysterectomy at this time. Advised I will let Melvia Heaps CNM know and will review with Dr.Miller regarding next steps. Patient is agreeable.  Dr.Miller, would you like to see this patient for a PUS to evaluate the size of her uterus and fibroids? Patient's last PUS was performed on 11/14/2014 which showed enlarged uterus measuring 11 x 5.5 x 5cm with 3.0 and 1.0cm fibroids.

## 2015-11-17 NOTE — Telephone Encounter (Signed)
Left message to call Kaitlyn at [phone]. 

## 2015-11-17 NOTE — Telephone Encounter (Signed)
Spoke with patient. Advised of message as seen below from [facility]. Patient is agreeable and verbalizes understanding. PUS appointment scheduled for 11/27/2015 at 2:30 pm with 3 pm consult with Dr.Miller. Order for PUS placed for precert.  Routing to provider for final review. Patient agreeable to disposition. Will close encounter.

## 2015-11-27 ENCOUNTER — Encounter: Payer: Self-pay | Admitting: Obstetrics & Gynecology

## 2015-11-27 ENCOUNTER — Ambulatory Visit (INDEPENDENT_AMBULATORY_CARE_PROVIDER_SITE_OTHER): Payer: BLUE CROSS/BLUE SHIELD | Admitting: Obstetrics & Gynecology

## 2015-11-27 ENCOUNTER — Ambulatory Visit (INDEPENDENT_AMBULATORY_CARE_PROVIDER_SITE_OTHER): Payer: BLUE CROSS/BLUE SHIELD

## 2015-11-27 VITALS — BP 110/78 | HR 80 | Resp 16 | Wt 148.0 lb

## 2015-11-27 DIAGNOSIS — N852 Hypertrophy of uterus: Secondary | ICD-10-CM

## 2015-11-27 DIAGNOSIS — N920 Excessive and frequent menstruation with regular cycle: Secondary | ICD-10-CM | POA: Diagnosis not present

## 2015-11-27 DIAGNOSIS — D251 Intramural leiomyoma of uterus: Secondary | ICD-10-CM

## 2015-11-27 NOTE — Progress Notes (Signed)
GYNECOLOGY  VISIT   HPI: 50 y.o. G99P2002 Married Caucasian female with complaint of worsening vaginal bleeding.  Reports she in [place] in July and had an episode of bleeding that soaked through every thing and got on the floor of the bathroom.  Reports she was so embarassed about the bleeding.  Cycles are still monthly, lasting 4-5 days.  On the heaviest days, she is using pads and tampons and can change every half hour to hour.  She does pass clots, largest can be quarter sized.   She has been on OCPs and micronor in the past without significant change in symptoms.  She wants to discuss options today.  [place], myomectomy, progsterone IUD use, endometrial ablation and hysterectomy discussed.  Procedures, recovery, risks generally discussed.  Pt states she never wants to see another drop of blood again so she is leaning more towards a hysterectomy.  Needed to get some more information, which she's gotten today.  [facility] hysterectomy brochure given.  Ultrasound done today as well. Uterus:  10 x 6 x 5cm with fibroids 1.4cm, 1.4cm, and 1.6cm (no significant change from prior PUS) Enodmetrium:  40mm with possible posterior encroachment of fibroid Left ovary:  2.2 x 2.2 x 1.4cm Right ovary 2.1 x 1.5 x 1.0cm [place] sac: negative for free fluid   GYNECOLOGIC HISTORY: Patient's last menstrual period was 11/08/2015 (exact date). Contraception: none Menopausal hormone therapy: none  Patient Active Problem List   Diagnosis Date Noted  . Nephrolithiasis 12/06/2014  . Fibroids, intramural 11/21/2014    Past Medical History:  Diagnosis Date  . 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 History of kidney stones    10 years with uti's  . Kidney stones     Past Surgical History:  Procedure Laterality Date  . CESAREAN SECTION  x2  . LITHOTRIPSY  7/16    MEDS:  Reviewed in EPIC and UTD  ALLERGIES: Review of patient's allergies indicates no known allergies.  Family History  Problem  Relation Age of Onset  . Diabetes Father 16    SH:  Married, non smoker  Review of Systems  Genitourinary:       Menorrhagia  All other systems reviewed and are negative.   PHYSICAL EXAMINATION:    BP 110/78 (BP Location: Right Arm, Patient Position: Sitting, Cuff Size: Normal)   Pulse 80   Resp 16   Wt 148 lb (67.1 kg)   LMP 11/08/2015 (Exact Date)   BMI 28.19 kg/m     General appearance: alert, cooperative and appears stated age CV:  Regular rate and rhythm Lungs:  clear to auscultation, no wheezes, rales or rhonchi, symmetric air entry Abdomen: soft, non-tender; bowel sounds normal; no masses,  no organomegaly  Pelvic: External genitalia:  no lesions              Urethra:  normal appearing urethra with no masses, tenderness or lesions              Bartholins and Skenes: normal                 Vagina: normal appearing vagina with normal color and discharge, no lesions              Cervix: no lesions              Bimanual Exam:  Uterus:  enlarged, 10 weeks size, anteflexed, does not feel fixed  Adnexa: no mass, fullness, tenderness              Rectovaginal: No.              Anus:  normal sphincter tone, no lesions  Chaperone was present for exam.  Assessment: Menorrhagia Uterine fibroids H/O cesarean section x 2  Plan: Pt deciding about options.  She will need an endometrial biopsy before proceeding with treatment.  She would like to do this at a follow up visit. Will also plan to check hb as well.  She would like to do this at follow up visit.  She is going to talk with her husband and work and then will call with decision regarding treatment.   ~40 minutes spent with patient >50% of time was in face to face discussion of above.

## 2015-11-29 DIAGNOSIS — N920 Excessive and frequent menstruation with regular cycle: Secondary | ICD-10-CM | POA: Insufficient documentation

## 2015-12-03 ENCOUNTER — Telehealth: Payer: Self-pay | Admitting: Certified Nurse Midwife

## 2015-12-03 NOTE — Telephone Encounter (Signed)
Patient is calling to speak with [name]. She states she spoke with [facility] yesterday and is waiting to speak with [name].

## 2015-12-04 NOTE — Telephone Encounter (Signed)
Return call from patient. Would like to proceed with 01-13-16 surgery date. Surgery instruction sheet reviewed and printed copy will be mailed to patient.  Surgery scheduled for 01-13-16 at 0730 at [place] to provider for final review. Patient agreeable to disposition. Will close encounter.

## 2015-12-04 NOTE — Telephone Encounter (Signed)
Call to patient. Surgery date options discussed with patient. She desires end of November or first week of December. Advised 11/27 or 12/5 are available. She will discuss with husband and call back.

## 2015-12-04 NOTE — Telephone Encounter (Signed)
Patient is returning a call to Sally. °

## 2015-12-04 NOTE — Telephone Encounter (Signed)
Call back to patient, left message to call back.

## 2015-12-10 ENCOUNTER — Telehealth: Payer: Self-pay | Admitting: Obstetrics & Gynecology

## 2015-12-10 NOTE — Telephone Encounter (Signed)
Patient has questions regarding surgery paperwork.

## 2015-12-10 NOTE — Telephone Encounter (Addendum)
Spoke with patient. Patient states she is scheduled for [facility] 01/13/16. Patient states she has work leave papers that need to be filled out by office. Patient provided with fax [phone]. Patient to fax forms. Advised will receive call from [facility] to review process. Patient verbalizes understanding and is agreeable.   Routing to provider for final review. Patient is agreeable to disposition. Will close encounter.   Cc: Lerry Liner

## 2015-12-30 ENCOUNTER — Ambulatory Visit (INDEPENDENT_AMBULATORY_CARE_PROVIDER_SITE_OTHER): Payer: BLUE CROSS/BLUE SHIELD | Admitting: Obstetrics & Gynecology

## 2015-12-30 ENCOUNTER — Encounter: Payer: Self-pay | Admitting: Obstetrics & Gynecology

## 2015-12-30 VITALS — BP 128/66 | HR 80 | Resp 14 | Ht 60.75 in | Wt 144.0 lb

## 2015-12-30 DIAGNOSIS — D251 Intramural leiomyoma of uterus: Secondary | ICD-10-CM | POA: Diagnosis not present

## 2015-12-30 DIAGNOSIS — N852 Hypertrophy of uterus: Secondary | ICD-10-CM | POA: Diagnosis not present

## 2015-12-30 DIAGNOSIS — N920 Excessive and frequent menstruation with regular cycle: Secondary | ICD-10-CM | POA: Diagnosis not present

## 2015-12-30 DIAGNOSIS — N858 Other specified noninflammatory disorders of uterus: Secondary | ICD-10-CM | POA: Diagnosis not present

## 2015-12-30 MED ORDER — IBUPROFEN 800 MG PO TABS: 800.0000 mg | ORAL_TABLET | Freq: Three times a day (TID) | ORAL | 0 refills | Status: DC | PRN

## 2015-12-30 MED ORDER — OXYCODONE-ACETAMINOPHEN 5-325 MG PO TABS: 2.0000 | ORAL_TABLET | ORAL | 0 refills | Status: DC | PRN

## 2015-12-30 NOTE — Progress Notes (Signed)
50 y.o. G70P2002 Married Caucasian female here for discussion of desires for management of menorrhagia and uterine fibroids.  Pt has experienced worse bleeding over the past year or so.  She had an episode this summer while traveling of socially embarrassing bleeding that has "pushed me over the edge".  She has decided to proceed with definitive management with hysterectomy and is here for discussion of procedure including risks and benefits.  She has been on OCPs without improvement and does not want to use any hormonal method.  Additional options for treatment including ablation, IUD and myomectomy have been discussed.  She just wants to be done with the bleeding.    Last ultrasound performed 11/27/15 showing uterus with fibroids but possible encroachment of fibroid into endometrium may be cause of change in bleeding this year.  Procedure discussed with patient.  Hospital stay, recovery and pain management all discussed.  Risks discussed including but not limited to bleeding, 1% risk of receiving a  transfusion, infection, 3-4% risk of bowel/bladder/ureteral/vascular injury discussed as well as possible need for additional surgery if injury does occur discussed.  DVT/PE and rare risk of death discussed.  My actual complications with prior surgeries discussed.  Vaginal cuff dehiscence discussed.  Hernia formation discussed.  Positioning and incision locations discussed.  Patient aware if pathology abnormal she may need additional treatment.  All questions answered.    Ob Hx:   Patient's last menstrual period was 12/29/2015.          Sexually active: Yes.   Birth control: no method Last pap: 11/11/15 negative Last MMG: 12/11/14 BIRADS 2 benign  Tobacco: Former smoker  Past Surgical History:  Procedure Laterality Date  . CESAREAN SECTION  x2  . LITHOTRIPSY  7/16    Past Medical History:  Diagnosis Date  . 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 History of kidney stones    10 years with  uti's  . Kidney stones     Allergies: Patient has no known allergies.  Current Outpatient Prescriptions  Medication Sig Dispense Refill  . Ascorbic Acid (VITAMIN C PO) Take 1 tablet by mouth every morning.    . Multiple Vitamins-Minerals (ZINC PO) Take 1 tablet by mouth every morning.    . naproxen sodium (ALEVE) 220 MG tablet Take 220 mg by mouth daily as needed (pain).    . niacin 500 MG tablet Take 500 mg by mouth at bedtime.    Marland Kitchen OVER THE COUNTER MEDICATION Place 1 drop into both eyes daily as needed (tired eyes). [employer] Tired Eyes eye drop     No current facility-administered medications for this visit.     ROS: A comprehensive review of systems was negative.  Exam:    BP 128/66 (BP Location: Right Arm, Patient Position: Sitting, Cuff Size: Normal)   Pulse 80   Resp 14   Ht 5' 0.75" (1.543 m)   Wt 144 lb (65.3 kg)   LMP 12/29/2015   BMI 27.43 kg/m   General appearance: alert and cooperative Head: Normocephalic, without obvious abnormality, atraumatic Neck: no adenopathy, supple, symmetrical, trachea midline and thyroid not enlarged, symmetric, no tenderness/mass/nodules Lungs: clear to auscultation bilaterally Heart: regular rate and rhythm, S1, S2 normal, no murmur, click, rub or gallop Abdomen: soft, non-tender; bowel sounds normal; no masses,  no organomegaly Extremities: extremities normal, atraumatic, no cyanosis or edema Skin: Skin color, texture, turgor normal. No rashes or lesions Lymph nodes: Cervical, supraclavicular, and axillary nodes normal. no  inguinal nodes palpated Neurologic: Grossly normal  Pelvic: External genitalia:  no lesions              Urethra: normal appearing urethra with no masses, tenderness or lesions              Bartholins and Skenes: normal                 Vagina: normal appearing vagina with normal color and discharge, no lesions              Cervix: normal appearance              Pap taken: No.        Bimanual Exam:  Uterus:   enlarged to 10 week's size, uterus is globular                                      Adnexa:    normal adnexa in size, nontender and no masses                                      Rectovaginal: Deferred                                      Anus:  normal sphincter tone, no lesions  Endometrial biopsy recommended.  Discussed with patient.  Verbal and written consent obtained.   Procedure:  Speculum placed.  Cervix visualized and cleansed with betadine prep.  A single toothed tenaculum was applied to the anterior lip of the cervix.  Endometrial pipelle was advanced through the cervix into the endometrial cavity without difficulty.  Pipelle passed to 9cm.  Suction applied and pipelle removed with good tissue sample obtained.  Tenculum removed.  No bleeding noted.  Patient tolerated procedure well.   A: Menorrhagia, socially embarrassing at times Enlarged fibroid uterus  H/O cesarean section x 2 H/O nephrolithiasis  P:  TLH/bilateral salpingectomy, possible BSO, cystoscopy planned Rx for Motrin and Percocet given. Medications/Vitamins reviewed.  Hysterectomy brochure given for pre and post op instructions.  ~25 minutes spent with patient >50% of time was in face to face discussion of above.

## 2015-12-30 NOTE — Patient Instructions (Signed)
Your procedure is scheduled on:  Tuesday, Dec. 5, 2017  Enter through the [employer] of [facility] at:  6:00 AM   Pick up the phone at the desk and dial [phone].  Call this number if you have problems the morning of surgery: [phone].  Remember: Do NOT eat food or drink after:  Midnight Monday, Dec. 4, 2017  Take these medicines the morning of surgery with a SIP OF WATER:  None  Stop ALL herbal medications, Vitamin C and Aleve at this time   Do NOT wear jewelry (body piercing), metal hair clips/bobby pins, make-up, or nail polish. Do NOT wear lotions, powders, or perfumes.  You may wear deodorant. Do NOT shave for 48 hours prior to surgery. Do NOT bring valuables to the hospital. Contacts, dentures, or bridgework may not be worn into surgery.  Leave suitcase in car.  After surgery it may be brought to your room.  For patients admitted to the hospital, checkout time is 11:00 AM the day of discharge.

## 2015-12-31 ENCOUNTER — Other Ambulatory Visit: Payer: Self-pay | Admitting: Obstetrics & Gynecology

## 2015-12-31 ENCOUNTER — Encounter (HOSPITAL_COMMUNITY): Payer: Self-pay

## 2015-12-31 ENCOUNTER — Encounter (HOSPITAL_COMMUNITY)
Admission: RE | Admit: 2015-12-31 | Discharge: 2015-12-31 | Disposition: A | Discharge status: Home / Self Care | Payer: BLUE CROSS/BLUE SHIELD | Source: Ambulatory Visit | Attending: Obstetrics & Gynecology | Admitting: Obstetrics & Gynecology

## 2015-12-31 DIAGNOSIS — Z01812 Encounter for preprocedural laboratory examination: Secondary | ICD-10-CM | POA: Diagnosis present

## 2015-12-31 DIAGNOSIS — D259 Leiomyoma of uterus, unspecified: Secondary | ICD-10-CM | POA: Diagnosis not present

## 2015-12-31 DIAGNOSIS — N920 Excessive and frequent menstruation with regular cycle: Secondary | ICD-10-CM | POA: Diagnosis not present

## 2015-12-31 HISTORY — DX: Headache: R51

## 2015-12-31 HISTORY — DX: Headache, unspecified: R51.9

## 2015-12-31 LAB — CBC
HEMATOCRIT: 39.4 % (ref 36.0–46.0)
Hemoglobin: 13.4 g/dL (ref 12.0–15.0)
MCH: 30.5 pg (ref 26.0–34.0)
MCHC: 34 g/dL (ref 30.0–36.0)
MCV: 89.5 fL (ref 78.0–100.0)
Platelets: 272 10*3/uL (ref 150–400)
RBC: 4.4 MIL/uL (ref 3.87–5.11)
RDW: 13.1 % (ref 11.5–15.5)
WBC: 8.2 10*3/uL (ref 4.0–10.5)

## 2016-01-06 ENCOUNTER — Telehealth: Payer: Self-pay

## 2016-01-06 NOTE — Telephone Encounter (Signed)
-----   Message from Megan Salon, MD sent at 01/05/2016  5:13 PM EST ----- Please let pt know her endometrial biopsy was negative for abnormal cells.

## 2016-01-06 NOTE — Telephone Encounter (Signed)
Spoke with patient. Advised of results as seen below from Dr.Miller. Patient is agreeable and verbalizes understanding.   Routing to provider for final review. Patient agreeable to disposition. Will close encounter.  

## 2016-01-12 MED ORDER — DEXTROSE 5 % IV SOLN
2.0000 g | INTRAVENOUS | Status: AC
  Administered 2016-01-13: 2 g via INTRAVENOUS
  Filled 2016-01-12: qty 2

## 2016-01-13 ENCOUNTER — Ambulatory Visit (HOSPITAL_COMMUNITY)
Admission: RE | Admit: 2016-01-13 | Discharge: 2016-01-14 | Disposition: A | Discharge status: Home / Self Care | Payer: BLUE CROSS/BLUE SHIELD | Source: Ambulatory Visit | Attending: Obstetrics & Gynecology | Admitting: Obstetrics & Gynecology

## 2016-01-13 ENCOUNTER — Encounter (HOSPITAL_COMMUNITY)
Admission: RE | Disposition: A | Discharge status: Home / Self Care | Payer: Self-pay | Source: Ambulatory Visit | Attending: Obstetrics & Gynecology

## 2016-01-13 ENCOUNTER — Encounter (HOSPITAL_COMMUNITY): Payer: Self-pay

## 2016-01-13 ENCOUNTER — Ambulatory Visit (HOSPITAL_COMMUNITY): Payer: BLUE CROSS/BLUE SHIELD | Admitting: Anesthesiology

## 2016-01-13 DIAGNOSIS — Z87442 Personal history of urinary calculi: Secondary | ICD-10-CM | POA: Insufficient documentation

## 2016-01-13 DIAGNOSIS — Z833 Family history of diabetes mellitus: Secondary | ICD-10-CM | POA: Diagnosis not present

## 2016-01-13 DIAGNOSIS — N809 Endometriosis, unspecified: Secondary | ICD-10-CM

## 2016-01-13 DIAGNOSIS — N920 Excessive and frequent menstruation with regular cycle: Secondary | ICD-10-CM | POA: Insufficient documentation

## 2016-01-13 DIAGNOSIS — N852 Hypertrophy of uterus: Secondary | ICD-10-CM | POA: Diagnosis not present

## 2016-01-13 DIAGNOSIS — N803 Endometriosis of pelvic peritoneum: Secondary | ICD-10-CM | POA: Diagnosis not present

## 2016-01-13 DIAGNOSIS — Z87891 Personal history of nicotine dependence: Secondary | ICD-10-CM | POA: Insufficient documentation

## 2016-01-13 DIAGNOSIS — D251 Intramural leiomyoma of uterus: Secondary | ICD-10-CM | POA: Diagnosis not present

## 2016-01-13 DIAGNOSIS — N838 Other noninflammatory disorders of ovary, fallopian tube and broad ligament: Secondary | ICD-10-CM | POA: Diagnosis not present

## 2016-01-13 DIAGNOSIS — N736 Female pelvic peritoneal adhesions (postinfective): Secondary | ICD-10-CM | POA: Insufficient documentation

## 2016-01-13 DIAGNOSIS — N72 Inflammatory disease of cervix uteri: Secondary | ICD-10-CM | POA: Diagnosis not present

## 2016-01-13 DIAGNOSIS — D250 Submucous leiomyoma of uterus: Secondary | ICD-10-CM | POA: Insufficient documentation

## 2016-01-13 DIAGNOSIS — D259 Leiomyoma of uterus, unspecified: Secondary | ICD-10-CM | POA: Diagnosis not present

## 2016-01-13 HISTORY — PX: CYSTOSCOPY: SHX5120

## 2016-01-13 HISTORY — PX: LAPAROSCOPIC BILATERAL SALPINGECTOMY: SHX5889

## 2016-01-13 HISTORY — PX: LAPAROSCOPIC LYSIS OF ADHESIONS: SHX5905

## 2016-01-13 HISTORY — PX: LAPAROSCOPIC HYSTERECTOMY: SHX1926

## 2016-01-13 LAB — PREGNANCY, URINE: PREG TEST UR: NEGATIVE

## 2016-01-13 LAB — HEMOGLOBIN: HEMOGLOBIN: 12 g/dL (ref 12.0–15.0)

## 2016-01-13 SURGERY — HYSTERECTOMY, TOTAL, LAPAROSCOPIC
Anesthesia: General | Site: Urethra

## 2016-01-13 MED ORDER — OXYCODONE-ACETAMINOPHEN 5-325 MG PO TABS
1.0000 | ORAL_TABLET | ORAL | Status: DC | PRN
  Administered 2016-01-14: 2 via ORAL
  Administered 2016-01-14: 1 via ORAL
  Filled 2016-01-13: qty 1
  Filled 2016-01-13: qty 2

## 2016-01-13 MED ORDER — HYDROMORPHONE HCL 1 MG/ML IJ SOLN
0.2500 mg | INTRAMUSCULAR | Status: DC | PRN
  Administered 2016-01-13 (×2): 0.5 mg via INTRAVENOUS

## 2016-01-13 MED ORDER — BUPIVACAINE HCL (PF) 0.25 % IJ SOLN
INTRAMUSCULAR | Status: AC
  Filled 2016-01-13: qty 30

## 2016-01-13 MED ORDER — FENTANYL CITRATE (PF) 100 MCG/2ML IJ SOLN
INTRAMUSCULAR | Status: DC | PRN
  Administered 2016-01-13 (×5): 50 ug via INTRAVENOUS

## 2016-01-13 MED ORDER — MEPERIDINE HCL 25 MG/ML IJ SOLN: 6.2500 mg | INTRAMUSCULAR | Status: DC | PRN

## 2016-01-13 MED ORDER — DEXAMETHASONE SODIUM PHOSPHATE 10 MG/ML IJ SOLN
INTRAMUSCULAR | Status: DC | PRN
  Administered 2016-01-13: 4 mg via INTRAVENOUS

## 2016-01-13 MED ORDER — ONDANSETRON HCL 4 MG/2ML IJ SOLN
INTRAMUSCULAR | Status: AC
  Filled 2016-01-13: qty 2

## 2016-01-13 MED ORDER — SIMETHICONE 80 MG PO CHEW: 80.0000 mg | CHEWABLE_TABLET | Freq: Four times a day (QID) | ORAL | Status: DC | PRN

## 2016-01-13 MED ORDER — SCOPOLAMINE 1 MG/3DAYS TD PT72
MEDICATED_PATCH | TRANSDERMAL | Status: AC
  Administered 2016-01-13: 1.5 mg via TRANSDERMAL
  Filled 2016-01-13: qty 1

## 2016-01-13 MED ORDER — MIDAZOLAM HCL 2 MG/2ML IJ SOLN
INTRAMUSCULAR | Status: AC
  Filled 2016-01-13: qty 2

## 2016-01-13 MED ORDER — LIDOCAINE HCL (CARDIAC) 20 MG/ML IV SOLN
INTRAVENOUS | Status: AC
  Filled 2016-01-13: qty 5

## 2016-01-13 MED ORDER — SODIUM CHLORIDE 0.9 % IJ SOLN
INTRAMUSCULAR | Status: AC
  Filled 2016-01-13: qty 50

## 2016-01-13 MED ORDER — MIDAZOLAM HCL 2 MG/2ML IJ SOLN
INTRAMUSCULAR | Status: DC | PRN
Start: 2016-01-13 — End: 2016-01-13
  Administered 2016-01-13: 1 mg via INTRAVENOUS

## 2016-01-13 MED ORDER — PROPOFOL 10 MG/ML IV BOLUS
INTRAVENOUS | Status: AC
  Filled 2016-01-13: qty 20

## 2016-01-13 MED ORDER — ACETAMINOPHEN 10 MG/ML IV SOLN
1000.0000 mg | Freq: Once | INTRAVENOUS | Status: DC
  Filled 2016-01-13: qty 100

## 2016-01-13 MED ORDER — KETOROLAC TROMETHAMINE 30 MG/ML IJ SOLN: 30.0000 mg | Freq: Four times a day (QID) | INTRAMUSCULAR | Status: DC

## 2016-01-13 MED ORDER — BUPIVACAINE HCL (PF) 0.25 % IJ SOLN
INTRAMUSCULAR | Status: DC | PRN
  Administered 2016-01-13: 9 mL

## 2016-01-13 MED ORDER — HYDROMORPHONE HCL 1 MG/ML IJ SOLN
INTRAMUSCULAR | Status: AC
  Filled 2016-01-13: qty 1

## 2016-01-13 MED ORDER — PANTOPRAZOLE SODIUM 40 MG IV SOLR
40.0000 mg | Freq: Every day | INTRAVENOUS | Status: DC
  Administered 2016-01-13: 40 mg via INTRAVENOUS
  Filled 2016-01-13: qty 40

## 2016-01-13 MED ORDER — ENOXAPARIN SODIUM 40 MG/0.4ML ~~LOC~~ SOLN
40.0000 mg | SUBCUTANEOUS | Status: AC
  Administered 2016-01-13: 40 mg via SUBCUTANEOUS
  Filled 2016-01-13: qty 0.4

## 2016-01-13 MED ORDER — MORPHINE SULFATE (PF) 4 MG/ML IV SOLN
1.0000 mg | INTRAVENOUS | Status: DC | PRN
  Administered 2016-01-13: 2 mg via INTRAVENOUS
  Administered 2016-01-13: 1 mg via INTRAVENOUS
  Administered 2016-01-13: 2 mg via INTRAVENOUS
  Filled 2016-01-13 (×3): qty 1

## 2016-01-13 MED ORDER — ROCURONIUM BROMIDE 100 MG/10ML IV SOLN
INTRAVENOUS | Status: AC
  Filled 2016-01-13: qty 1

## 2016-01-13 MED ORDER — SODIUM CHLORIDE 0.9 % IJ SOLN
INTRAMUSCULAR | Status: DC | PRN
  Administered 2016-01-13: 30 mL via INTRAVENOUS

## 2016-01-13 MED ORDER — DEXAMETHASONE SODIUM PHOSPHATE 4 MG/ML IJ SOLN
INTRAMUSCULAR | Status: AC
Start: 2016-01-13 — End: 2016-01-13
  Filled 2016-01-13: qty 1

## 2016-01-13 MED ORDER — ROPIVACAINE HCL 5 MG/ML IJ SOLN
INTRAMUSCULAR | Status: AC
  Filled 2016-01-13: qty 30

## 2016-01-13 MED ORDER — ALUM & MAG HYDROXIDE-SIMETH 200-200-20 MG/5ML PO SUSP: 30.0000 mL | ORAL | Status: DC | PRN

## 2016-01-13 MED ORDER — ROCURONIUM BROMIDE 100 MG/10ML IV SOLN
INTRAVENOUS | Status: DC | PRN
  Administered 2016-01-13: 50 mg via INTRAVENOUS
  Administered 2016-01-13: 10 mg via INTRAVENOUS
  Administered 2016-01-13: 20 mg via INTRAVENOUS

## 2016-01-13 MED ORDER — LACTATED RINGERS IR SOLN
Status: DC | PRN
  Administered 2016-01-13: 3000 mL

## 2016-01-13 MED ORDER — DEXTROSE-NACL 5-0.45 % IV SOLN
INTRAVENOUS | Status: DC
  Administered 2016-01-13: 13:00:00 via INTRAVENOUS

## 2016-01-13 MED ORDER — METOCLOPRAMIDE HCL 5 MG/ML IJ SOLN: 10.0000 mg | Freq: Once | INTRAMUSCULAR | Status: DC | PRN

## 2016-01-13 MED ORDER — EPHEDRINE 5 MG/ML INJ
INTRAVENOUS | Status: AC
  Filled 2016-01-13: qty 10

## 2016-01-13 MED ORDER — SUGAMMADEX SODIUM 200 MG/2ML IV SOLN
INTRAVENOUS | Status: AC
  Filled 2016-01-13: qty 2

## 2016-01-13 MED ORDER — LIDOCAINE HCL (CARDIAC) 20 MG/ML IV SOLN
INTRAVENOUS | Status: DC | PRN
  Administered 2016-01-13: 70 mg via INTRAVENOUS
  Administered 2016-01-13: 30 mg via INTRAVENOUS

## 2016-01-13 MED ORDER — ONDANSETRON HCL 4 MG/2ML IJ SOLN
INTRAMUSCULAR | Status: DC | PRN
  Administered 2016-01-13: 4 mg via INTRAVENOUS

## 2016-01-13 MED ORDER — ACETAMINOPHEN 10 MG/ML IV SOLN
INTRAVENOUS | Status: DC | PRN
  Administered 2016-01-13: 1000 mg via INTRAVENOUS

## 2016-01-13 MED ORDER — KETOROLAC TROMETHAMINE 30 MG/ML IJ SOLN
INTRAMUSCULAR | Status: AC
  Filled 2016-01-13: qty 1

## 2016-01-13 MED ORDER — SODIUM CHLORIDE 0.9 % IV SOLN
INTRAVENOUS | Status: DC | PRN
  Administered 2016-01-13: 10:00:00

## 2016-01-13 MED ORDER — LACTATED RINGERS IV SOLN
INTRAVENOUS | Status: DC
  Administered 2016-01-13: 08:00:00 via INTRAVENOUS
  Administered 2016-01-13: 125 mL/h via INTRAVENOUS

## 2016-01-13 MED ORDER — MENTHOL 3 MG MT LOZG: 1.0000 | LOZENGE | OROMUCOSAL | Status: DC | PRN

## 2016-01-13 MED ORDER — ACETAMINOPHEN 325 MG PO TABS: 650.0000 mg | ORAL_TABLET | ORAL | Status: DC | PRN

## 2016-01-13 MED ORDER — FENTANYL CITRATE (PF) 250 MCG/5ML IJ SOLN
INTRAMUSCULAR | Status: AC
  Filled 2016-01-13: qty 5

## 2016-01-13 MED ORDER — STERILE WATER FOR IRRIGATION IR SOLN
Status: DC | PRN
  Administered 2016-01-13: 1000 mL

## 2016-01-13 MED ORDER — EPHEDRINE SULFATE 50 MG/ML IJ SOLN
INTRAMUSCULAR | Status: DC | PRN
  Administered 2016-01-13: 15 mg via INTRAVENOUS

## 2016-01-13 MED ORDER — SUGAMMADEX SODIUM 200 MG/2ML IV SOLN
INTRAVENOUS | Status: DC | PRN
  Administered 2016-01-13: 130.6 mg via INTRAVENOUS

## 2016-01-13 MED ORDER — PROPOFOL 10 MG/ML IV BOLUS
INTRAVENOUS | Status: DC | PRN
  Administered 2016-01-13: 180 mg via INTRAVENOUS

## 2016-01-13 MED ORDER — KETOROLAC TROMETHAMINE 30 MG/ML IJ SOLN
30.0000 mg | Freq: Four times a day (QID) | INTRAMUSCULAR | Status: DC
  Administered 2016-01-13 – 2016-01-14 (×4): 30 mg via INTRAVENOUS
  Filled 2016-01-13 (×4): qty 1

## 2016-01-13 MED ORDER — SCOPOLAMINE 1 MG/3DAYS TD PT72
1.0000 | MEDICATED_PATCH | Freq: Once | TRANSDERMAL | Status: DC
  Administered 2016-01-13: 1.5 mg via TRANSDERMAL

## 2016-01-13 SURGICAL SUPPLY — 70 items
ADH SKN CLS APL DERMABOND .7 (GAUZE/BANDAGES/DRESSINGS)
APL SKNCLS STERI-STRIP NONHPOA (GAUZE/BANDAGES/DRESSINGS)
APL SRG 38 LTWT LNG FL B (MISCELLANEOUS)
APPLICATOR ARISTA FLEXITIP XL (MISCELLANEOUS) IMPLANT
BAG SPEC RTRVL LRG 6X4 10 (ENDOMECHANICALS)
BENZOIN TINCTURE PRP APPL 2/3 (GAUZE/BANDAGES/DRESSINGS) IMPLANT
CABLE HIGH FREQUENCY MONO STRZ (ELECTRODE) ×6 IMPLANT
CATH ROBINSON RED A/P 16FR (CATHETERS) IMPLANT
CLOTH BEACON ORANGE TIMEOUT ST (SAFETY) ×6 IMPLANT
COVER BACK TABLE 60X90IN (DRAPES) ×4 IMPLANT
COVER LIGHT HANDLE  1/PK (MISCELLANEOUS) ×1
COVER LIGHT HANDLE 1/PK (MISCELLANEOUS) ×5 IMPLANT
DERMABOND ADVANCED (GAUZE/BANDAGES/DRESSINGS)
DERMABOND ADVANCED .7 DNX12 (GAUZE/BANDAGES/DRESSINGS) IMPLANT
DRSG OPSITE POSTOP 3X4 (GAUZE/BANDAGES/DRESSINGS) IMPLANT
DURAPREP 26ML APPLICATOR (WOUND CARE) ×6 IMPLANT
GLOVE BIOGEL PI IND STRL 7.0 (GLOVE) ×25 IMPLANT
GLOVE BIOGEL PI INDICATOR 7.0 (GLOVE) ×5
GLOVE ECLIPSE 6.5 STRL STRAW (GLOVE) ×18 IMPLANT
GOWN STRL REUS W/TWL LRG LVL3 (GOWN DISPOSABLE) ×24 IMPLANT
HEMOSTAT ARISTA ABSORB 3G PWDR (MISCELLANEOUS) IMPLANT
LIGASURE VESSEL 5MM BLUNT TIP (ELECTROSURGICAL) ×6 IMPLANT
NEEDLE INSUFFLATION 120MM (ENDOMECHANICALS) ×6 IMPLANT
NS IRRIG 1000ML POUR BTL (IV SOLUTION) ×6 IMPLANT
OCCLUDER COLPOPNEUMO (BALLOONS) ×6 IMPLANT
PACK LAPAROSCOPY BASIN (CUSTOM PROCEDURE TRAY) ×6 IMPLANT
PACK TRENDGUARD 450 HYBRID PRO (MISCELLANEOUS) ×1 IMPLANT
PACK TRENDGUARD 600 HYBRD PROC (MISCELLANEOUS) IMPLANT
POUCH LAPAROSCOPIC INSTRUMENT (MISCELLANEOUS) ×4 IMPLANT
POUCH SPECIMEN RETRIEVAL 10MM (ENDOMECHANICALS) IMPLANT
PROTECTOR NERVE ULNAR (MISCELLANEOUS) ×12 IMPLANT
SCISSORS LAP 5X35 DISP (ENDOMECHANICALS) ×6 IMPLANT
SEALER TISSUE G2 CVD JAW 35 (ENDOMECHANICALS) IMPLANT
SEALER TISSUE G2 CVD JAW 45CM (ENDOMECHANICALS)
SET CYSTO W/LG BORE CLAMP LF (SET/KITS/TRAYS/PACK) ×4 IMPLANT
SET IRRIG TUBING LAPAROSCOPIC (IRRIGATION / IRRIGATOR) ×6 IMPLANT
SET TRI-LUMEN FLTR TB AIRSEAL (TUBING) ×6 IMPLANT
SHEARS HARMONIC ACE PLUS 36CM (ENDOMECHANICALS) ×2 IMPLANT
SLEEVE ADV FIXATION 5X100MM (TROCAR) ×6 IMPLANT
SLEEVE XCEL OPT CAN 5 100 (ENDOMECHANICALS) ×6 IMPLANT
SOLUTION ELECTROLUBE (MISCELLANEOUS) ×6 IMPLANT
SPONGE GAUZE 4X4 12PLY STER LF (GAUZE/BANDAGES/DRESSINGS) ×4 IMPLANT
STRIP CLOSURE SKIN 1/2X4 (GAUZE/BANDAGES/DRESSINGS) IMPLANT
SUT VIC AB 0 CT1 27 (SUTURE) ×12
SUT VIC AB 0 CT1 27XBRD ANBCTR (SUTURE) ×10 IMPLANT
SUT VICRYL 0 UR6 27IN ABS (SUTURE) ×2 IMPLANT
SUT VICRYL 4-0 PS2 18IN ABS (SUTURE) ×6 IMPLANT
SUT VLOC 180 0 9IN  GS21 (SUTURE) ×1
SUT VLOC 180 0 9IN GS21 (SUTURE) ×5 IMPLANT
SYR 30ML LL (SYRINGE) IMPLANT
SYR 50ML LL SCALE MARK (SYRINGE) ×12 IMPLANT
SYRINGE 10CC LL (SYRINGE) ×6 IMPLANT
SYSTEM CARTER THOMASON II (TROCAR) IMPLANT
TIP UTERINE 5.1X6CM LAV DISP (MISCELLANEOUS) IMPLANT
TIP UTERINE 6.7X10CM GRN DISP (MISCELLANEOUS) ×2 IMPLANT
TIP UTERINE 6.7X6CM WHT DISP (MISCELLANEOUS) IMPLANT
TIP UTERINE 6.7X8CM BLUE DISP (MISCELLANEOUS) ×2 IMPLANT
TOWEL OR 17X24 6PK STRL BLUE (TOWEL DISPOSABLE) ×12 IMPLANT
TRAY FOLEY CATH SILVER 14FR (SET/KITS/TRAYS/PACK) ×6 IMPLANT
TRENDGUARD 450 HYBRID PRO PACK (MISCELLANEOUS) ×6
TRENDGUARD 600 HYBRID PROC PK (MISCELLANEOUS)
TROCAR ADV FIXATION 5X100MM (TROCAR) ×6 IMPLANT
TROCAR BALLN 12MMX100 BLUNT (TROCAR) IMPLANT
TROCAR PORT AIRSEAL 5X120 (TROCAR) IMPLANT
TROCAR PORT AIRSEAL 8X120 (TROCAR) IMPLANT
TROCAR XCEL NON BLADE 8MM B8LT (ENDOMECHANICALS) ×2 IMPLANT
TROCAR XCEL NON-BLD 11X100MML (ENDOMECHANICALS) IMPLANT
TROCAR XCEL NON-BLD 5MMX100MML (ENDOMECHANICALS) ×6 IMPLANT
WARMER LAPAROSCOPE (MISCELLANEOUS) ×6 IMPLANT
WATER STERILE IRR 1000ML POUR (IV SOLUTION) ×6 IMPLANT

## 2016-01-13 NOTE — Anesthesia Postprocedure Evaluation (Signed)
Anesthesia Post Note  Patient: Amy Curry  Procedure(s) Performed: Procedure(s) (LRB): HYSTERECTOMY TOTAL LAPAROSCOPIC WITH PERITONEAL BIOPSY (N/A) LAPAROSCOPIC BILATERAL SALPINGECTOMY (Bilateral) CYSTOSCOPY (N/A) LAPAROSCOPIC LYSIS OF ADHESIONS  Patient location during evaluation: [facility] Anesthesia Type: General Level of consciousness: awake, awake and alert and oriented Pain management: pain level not controlled (Nurse gave toradol and now giving morphine) Vital Signs Assessment: post-procedure vital signs reviewed and stable Respiratory status: spontaneous breathing and respiratory function stable Cardiovascular status: stable Postop Assessment: no headache, no backache, patient able to bend at knees, no signs of nausea or vomiting and adequate PO intake     Last Vitals:  Vitals:   01/13/16 1127 01/13/16 1230  BP: (!) 102/59 112/62  Pulse: 91 92  Resp:  18  Temp: 36.7 C 36.7 C    Last Pain:  Vitals:   01/13/16 1352  TempSrc:   PainSc: 8    Pain Goal: Patients Stated Pain Goal: 4 (01/13/16 1352)               Lyncoln Maskell

## 2016-01-13 NOTE — Anesthesia Postprocedure Evaluation (Signed)
Anesthesia Post Note  Patient: Amy Curry  Procedure(s) Performed: Procedure(s) (LRB): HYSTERECTOMY TOTAL LAPAROSCOPIC WITH PERITONEAL BIOPSY (N/A) LAPAROSCOPIC BILATERAL SALPINGECTOMY (Bilateral) CYSTOSCOPY (N/A) LAPAROSCOPIC LYSIS OF ADHESIONS  Patient location during evaluation: PACU Anesthesia Type: General Level of consciousness: awake and alert and oriented Pain management: pain level controlled Vital Signs Assessment: post-procedure vital signs reviewed and stable Respiratory status: spontaneous breathing, nonlabored ventilation, respiratory function stable and patient connected to nasal cannula oxygen Cardiovascular status: blood pressure returned to baseline and stable Postop Assessment: no signs of nausea or vomiting Anesthetic complications: no     Last Vitals:  Vitals:   01/13/16 1030 01/13/16 1045  BP: (!) 105/55 109/63  Pulse: 90 (!) 105  Resp: 18 19  Temp:      Last Pain:  Vitals:   01/13/16 1045  TempSrc:   PainSc: 5    Pain Goal: Patients Stated Pain Goal: 4 (01/13/16 1045)               Tama Grosz A.

## 2016-01-13 NOTE — Anesthesia Preprocedure Evaluation (Addendum)
Anesthesia Evaluation  Patient identified by MRN, date of birth, ID band Patient awake    Reviewed: Allergy & Precautions, NPO status , Patient's Chart, lab work & pertinent test results  Airway Mallampati: II       Dental  (+) Teeth Intact, Partial Upper,    Pulmonary former smoker,    Pulmonary exam normal breath sounds clear to auscultation       Cardiovascular negative cardio ROS Normal cardiovascular exam Rhythm:Regular Rate:Normal     Neuro/Psych  Headaches, negative psych ROS   GI/Hepatic negative GI ROS, Neg liver ROS,   Endo/Other  negative endocrine ROS  Renal/GU Renal diseaseHx/o renal calculi  negative genitourinary   Musculoskeletal negative musculoskeletal ROS (+)   Abdominal   Peds  Hematology negative hematology ROS (+)   Anesthesia Other Findings   Reproductive/Obstetrics Menorrhagia Intramural leiomyoma                            Lab Results  Component Value Date   WBC 8.2 12/31/2015   HGB 13.4 12/31/2015   HCT 39.4 12/31/2015   MCV 89.5 12/31/2015   PLT 272 12/31/2015    Anesthesia Physical Anesthesia Plan  ASA: II  Anesthesia Plan: General   Post-op Pain Management:    Induction:   Airway Management Planned: Oral ETT  Additional Equipment:   Intra-op Plan:   Post-operative Plan: Extubation in OR  Informed Consent: I have reviewed the patients History and Physical, chart, labs and discussed the procedure including the risks, benefits and alternatives for the proposed anesthesia with the patient or authorized representative who has indicated his/her understanding and acceptance.   Dental advisory given  Plan Discussed with: Surgeon, Anesthesiologist and CRNA  Anesthesia Plan Comments:         Anesthesia Quick Evaluation

## 2016-01-13 NOTE — Transfer of Care (Signed)
Immediate Anesthesia Transfer of Care Note  Patient: Amy Curry  Procedure(s) Performed: Procedure(s): HYSTERECTOMY TOTAL LAPAROSCOPIC WITH PERITONEAL BIOPSY (N/A) LAPAROSCOPIC BILATERAL SALPINGECTOMY (Bilateral) CYSTOSCOPY (N/A) LAPAROSCOPIC LYSIS OF ADHESIONS  Patient Location: PACU  Anesthesia Type:General  Level of Consciousness: awake, alert , oriented and patient cooperative  Airway & Oxygen Therapy: Patient Spontanous Breathing and Patient connected to nasal cannula oxygen  Post-op Assessment: Report given to RN and Post -op Vital signs reviewed and stable  Post vital signs: Reviewed and stable  Last Vitals:  Vitals:   01/13/16 0603  BP: 123/77  Pulse: 89  Resp: 16  Temp: 36.9 C    Last Pain:  Vitals:   01/13/16 0603  TempSrc: Oral      Patients Stated Pain Goal: 4 ([id number])  Complications: No apparent anesthesia complications

## 2016-01-13 NOTE — Addendum Note (Signed)
Addendum  created 01/13/16 1406 by Elenore Paddy, CRNA   Sign clinical note

## 2016-01-13 NOTE — H&P (Signed)
Amy Curry is an 50 y.o. female  G2P2 MWF here for definitive management of menorrhagia and uterine fibroids.  Pre op endometrial biopsy was negative for abnormal cells.  Uterus measure 10 x 6 x 5 with several fibroids.  She has been counseled about risks and benefits as well as alternatives and is ready to proceed.  She does desire to have ovarian preservation.  Pertinent Gynecological History: Menses: heavy Contraception: none DES exposure: denies Blood transfusions: none Sexually transmitted diseases: no past history Previous GYN Procedures: none  Last mammogram: normal Date: 11/16 Last pap: normal Date: 11/17 OB History: G2, P2   Menstrual History: Patient's last menstrual period was 12/29/2015 (exact date).    Past Medical History:  Diagnosis Date  . 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 Headache    Migraines  . History of kidney stones    10 years with uti's  . Kidney stones     Past Surgical History:  Procedure Laterality Date  . CESAREAN SECTION  x2  . LITHOTRIPSY  7/16    Family History  Problem Relation Age of Onset  . Diabetes Father 63    Social History:  reports that she has quit smoking. She has never used smokeless tobacco. She reports that she drinks about 0.6 oz of alcohol per week . She reports that she does not use drugs.  Allergies: No Known Allergies  Prescriptions Prior to Admission  Medication Sig Dispense Refill Last Dose  . Ascorbic Acid (VITAMIN C PO) Take 1 tablet by mouth every morning.   Past Month at Unknown time  . ibuprofen (ADVIL,MOTRIN) 800 MG tablet Take 1 tablet (800 mg total) by mouth every 8 (eight) hours as needed. 30 tablet 0 Past Month at Unknown time  . Multiple Vitamins-Minerals (ZINC PO) Take 1 tablet by mouth every morning.   Past Month at Unknown time  . naproxen sodium (ALEVE) 220 MG tablet Take 220 mg by mouth daily as needed (pain).   Past Month at Unknown time  . niacin 500 MG tablet Take 500 mg by mouth at  bedtime.   01/12/2016 at Unknown time  . OVER THE COUNTER MEDICATION Place 1 drop into both eyes daily as needed (tired eyes). [employer] Tired Eyes eye drop   01/12/2016 at Unknown time  . oxyCODONE-acetaminophen (PERCOCET/ROXICET) 5-325 MG tablet Take 2 tablets by mouth every 4 (four) hours as needed for severe pain. 30 tablet 0 More than a month at Unknown time    Review of Systems  All other systems reviewed and are negative.   Blood pressure 123/77, pulse 89, temperature 98.4 F (36.9 C), temperature source Oral, resp. rate 16, last menstrual period 12/29/2015, SpO2 100 %. Physical Exam  Constitutional: She is oriented to person, place, and time. She appears well-developed and well-nourished.  Cardiovascular: Normal rate and regular rhythm.   Respiratory: Effort normal.  GI: Soft. Bowel sounds are normal.  Neurological: She is alert and oriented to person, place, and time.  Skin: Skin is warm and dry.  Psychiatric: She has a normal mood and affect.    Results for orders placed or performed during the hospital encounter of 01/13/16 (from the past 24 hour(s))  Pregnancy, urine     Status: None   Collection Time: 01/13/16  6:00 AM  Result Value Ref Range   Preg Test, Ur NEGATIVE NEGATIVE    No results found.  Assessment/Plan: 50 yo G2P2 MWF with menorrhagia and uterine fibroids  her for definitive treatment with TLH/bilateral salpingectomy, possible BSO, cystoscopy.  All questions answered.  Pt here and ready to proceed.  [name] 01/13/2016, 7:14 AM

## 2016-01-13 NOTE — Progress Notes (Signed)
Day of Surgery Procedure(s) (LRB): HYSTERECTOMY TOTAL LAPAROSCOPIC WITH PERITONEAL BIOPSY (N/A) LAPAROSCOPIC BILATERAL SALPINGECTOMY (Bilateral) CYSTOSCOPY (N/A) LAPAROSCOPIC LYSIS OF ADHESIONS  Subjective: Patient reports adequate pain control.  She has no nausea.  She has voided.  She has walked without difficulty.    Objective: I have reviewed patient's vital signs, intake and output, medications and labs.  General: alert and cooperative Resp: clear to auscultation bilaterally Cardio: regular rate and rhythm, S1, S2 normal, no murmur, click, rub or gallop GI: soft, non-tender; bowel sounds normal; no masses,  no organomegaly and incision: clean, dry and intact Extremities: extremities normal, atraumatic, no cyanosis or edema Vaginal Bleeding: minimal  Assessment: s/p Procedure(s): HYSTERECTOMY TOTAL LAPAROSCOPIC WITH PERITONEAL BIOPSY (N/A) LAPAROSCOPIC BILATERAL SALPINGECTOMY (Bilateral) CYSTOSCOPY (N/A) LAPAROSCOPIC LYSIS OF ADHESIONS: stable and progressing well  Plan: Advance diet Encourage ambulation  LOS: 0 days    [name] 01/13/2016, 6:30 PM

## 2016-01-13 NOTE — Anesthesia Procedure Notes (Signed)
Procedure Name: Intubation Date/Time: 01/13/2016 7:33 AM Performed by: Tobin Chad Pre-anesthesia Checklist: Patient identified, Emergency Drugs available, Suction available and Patient being monitored Patient Re-evaluated:Patient Re-evaluated prior to inductionOxygen Delivery Method: Simple face mask Preoxygenation: Pre-oxygenation with 100% oxygen Intubation Type: IV induction Ventilation: Mask ventilation without difficulty Laryngoscope Size: Mac and 3 Grade View: Grade II Tube type: Oral Tube size: 7.0 mm Number of attempts: 1 Airway Equipment and Method: Stylet Placement Confirmation: ETT inserted through vocal cords under direct vision,  positive ETCO2 and breath sounds checked- equal and bilateral Secured at: 21 (com) cm Tube secured with: Tape Dental Injury: Teeth and Oropharynx as per pre-operative assessment

## 2016-01-13 NOTE — Op Note (Signed)
01/13/2016  10:10 AM  PATIENT:  Amy Curry  50 y.o. female  PRE-OPERATIVE DIAGNOSIS:  menorrhagia, fibroids, endometriosis, significant pelvic adhesions with uterus completely adhered to the anterior abdominal wall and left side wall  POST-OPERATIVE DIAGNOSIS:  menorrhagia, fibroids, endometriosis, significant pelvic adhesions with uterus completely adhered to the anterior abdominal wall and left side wall  PROCEDURE:  Procedure(s): HYSTERECTOMY TOTAL LAPAROSCOPIC WITH PERITONEAL BIOPSY LAPAROSCOPIC BILATERAL SALPINGECTOMY CYSTOSCOPY LAPAROSCOPIC LYSIS OF ADHESIONS  SURGEON:  Winfred Redel SUZANNE  ASSISTANTS: Sumner Boast, MD   ANESTHESIA:   general  ESTIMATED BLOOD LOSS: 75cc  BLOOD ADMINISTERED:none   FLUIDS: 1400cc LR  UOP: 475cc clear uop  SPECIMEN:  Uterus, cervix, bilateral fallopian tubes, peritoneal biopsy  DISPOSITION OF SPECIMEN:  PATHOLOGY  FINDINGS: normal ovaries, area of endometriosis on peritoneum on left side of pelvis with several other small areas in same region, significant adhesions of uterus to anterior abdominal wall above area of prior cesarean section and along the left sidewall, normal posterior cul de sac  DESCRIPTION OF OPERATION: Patient is taken to the operating room. She is placed in the supine position. She is a running IV in place. Informed consent was present on the chart. SCDs on her lower extremities and functioning properly.  pt was positioned and arms tucked by her sides before anesthesia was administered.  Then general endotracheal anesthesia was administered by the anesthesia staff without difficulty. Dr. Royce Macadamia oversaw case. Once adequate anesthesia was confirmed, time out was performed.    Dura prep was then used to prep the abdomen and Betadine was used to prep the inner thighs, perineum and vagina. Once 3 minutes had past the patient was draped in a normal standard fashion. The legs were lifted to the high lithotomy position. The  cervix was visualized by placing a heavy weighted speculum in the posterior aspect of the vagina and using a curved Deaver retractor to the retract anteriorly. The anterior lip of the cervix was grasped with single-tooth tenaculum.  The cervix sounded to 11 cm. Pratt dilators were used to dilate the cervix up to a #21. A RUMI uterine manipulator was obtained. A #10 disposable tip was placed on the RUMI manipulator as well as a small, silver KOH ring.  It was very difficult to pass this through the cervix due to the angle of the uterus.  It felt as thought the uterus was adhered to the abdominal wall and pulled to the left.  Decision made to place abdominal instruments at this point.  The umbilicus was everted.  A Veress needle was obtained. Syringe of sterile saline was placed on a open Veress needle.  This was passed into the umbilicus until just when the fluid started to drip.  Then low flow CO2 gas was attached the needle and the pneumoperitoneum was achieved without difficulty. Once four liters of gas was in the abdomen the Veress needle was removed and a 5 millimeter non-bladed Optiview trocar and port were passed directly to the abdomen. The laparoscope was then used to confirm intraperitoneal placement. The uterus was indeed adhered to the anterior abdominal wall and to the left side wall.  There was some endometriosis appearing lesions on the left peritoneum and left side wall.  Ovaries were normal.  Locations for RLQ and LLQ ports were noted by transillumination of the abdominal wall.  0.25% marcaine was used to anesthetize the skin.  77mm skin incisions were made and then 67mm bladed ports were placed.  Finally a midline incision was  made about 4 cm above the pubic symphysis.  The skin was incised about 1cm and a non-bladed 8 Airseal port was placed with direct visualization of the laparoscope. Ureters were identified.     Foley was placed to SD at this point.  Then using the Harmonic scalpel, the  adhesions of the uterus to the anterior abdominal wall were taken down keeping very close to the anterior abdominal wall.  This gave enough mobility of the uterus for me to feel like I could place the RUMI uterine manipulator.  Attention was turned back to the vagina.  The heavy weighted speculum and Deaver retractor were placed back into the vagina and the anterior lip of the cervix was grasped with a single toothed tenaculum.  The RUMI uterine manipulator was then passed through the cervix and the bulb of the disposable tip was inflated with 10 cc of normal saline. There was a good fit of the KOH ring around the cervix. The tenaculum was removed. There is also good manipulation of the uterus. The speculum and retractor were removed as well.  Legs were lowered to the low lithotomy position and attention was turned the abdomen.  Attention was turned to the right side. With uterus on stretch the right tube was excised off the ovary and mesosalpinx was dissected to free the tube. Then the right utero-ovarian pedicle was serially clamped cauterized and incised using the ligasure device.  Right round ligament was serially clamped cauterized and incised. The anterior and posterior peritoneum of the inferior leaf of the broad ligament were opened. The beginning of the bladder flap was created.  Due to the adhesions on the left side, this dissection could not be taken very far across so attention was turned to the left side.    The uterus was placed on stretch to the opposite side.  The tube was excised off the ovary using sharp dissection a bipolar cautery.  The mesosalpinx was incised freeing the tube. Then the left uterine ovarian pedicle was serially clamped cauterized and incised. Next the left round ligament was serially clamped cauterized and incised. The posterior peritoneum of the inferior leaf of the was opened.   With careful dissection, the adhesions to the left sidewall were dissected slowly, being  sure of all locations of pelvic anatomy.  The bladder was inflated with sterile water to identify the bladder.  Once the bladder could be fully seen, dissection was continued.  Eventually, the uterus was fully freed from the side wall.  Then the anterior peritoneum was incised and the bladder flap was created.  Once the bladder was well below the level of the KOH ring, the left uterine artery was skeletonized.  Then the left uterine artery, above the level of the KOH ring, was serially clamped cauterized and incised.  Next the right uterine artery skeletonized and then just superior to the KOH ring this vessel was serially clamped, cauterized, and incised.  The uterus was devascularized at this point.  This dissection took an additional 45 minutes in comparison to a typical similar surgery without the adhesions.    The colpotomy was performed a starting in the midline and using a harmonic scalpel with the inferior edge of the open blade  This was carried around a circumferential fashion until the vaginal mucosa was completely incised in the specimen was freed.  The specimen was then delivered to the vagina.  A vaginal occlusive device was used to maintain the pneumoperitoneum  Instruments were changed with a  needle driver and million dollar graspers.  Using a 9 inch V. lock suture, the cuff was closed by incorporating the anterior and posterior vaginal mucosa in each stitch. This was carried across all the way to the left corner and a running fashion. Two stitches were brought back towards the midline and the suture was cut flush with the vagina. The needle was brought out the pelvis. The pelvis was irrigated. All pedicles were inspected. No bleeding was noted. In Interceed was placed across vaginal cuff. Ureters were noted deep in the pelvis to be peristalsing.    At this point, the larger area of endometriosis was removed by retracting the peritoneum and making a small incision with the harmonic scalpel.   Then the peritoneum was incised to remove the specimen.  The other smaller areas were cauterized without difficulty.  At this point the procedure was completed.  Inferior ports were removed.  Pneumoperitoneum was relived.  Midline port was removed.  As all ports were 8 and smaller, fascial closure was not needed.    The skin was then closed with subcuticular stitches of 3-0 Vicryl. The skin was cleansed Dermabond was applied. Attention was then turned the vagina and the cuff was inspected. No bleeding was noted. The anterior posterior vaginal mucosa was incorporated in each stitch. The Foley catheter was removed.  Cystoscopy was performed.  No sutures or bladder injuries were noted.  Ureters were noted with normal urine jets from each one was seen.  Foley was replaced and the cystoscopic fluid was drained.  Sponge, lap, needle, initially counts were correct x2. Patient tolerated the procedure very well. She was awakened from anesthesia, extubated and taken to recovery in stable condition.   COUNTS:  YES  PLAN OF CARE: Transfer to PACU

## 2016-01-14 ENCOUNTER — Encounter (HOSPITAL_COMMUNITY): Payer: Self-pay | Admitting: Obstetrics & Gynecology

## 2016-01-14 DIAGNOSIS — N920 Excessive and frequent menstruation with regular cycle: Secondary | ICD-10-CM | POA: Diagnosis not present

## 2016-01-14 DIAGNOSIS — N809 Endometriosis, unspecified: Secondary | ICD-10-CM

## 2016-01-14 MED ORDER — NAPROXEN SODIUM 220 MG PO TABS: 220.0000 mg | ORAL_TABLET | Freq: Every day | ORAL | Status: DC | PRN

## 2016-01-14 NOTE — Discharge Summary (Signed)
Physician Discharge Summary  Patient ID: Amy Curry MRN: [id number] DOB/AGE: 1965-06-21 50 y.o.  Admit date: 01/13/2016 Discharge date: 01/14/2016  Admission Diagnoses: menorrhagia, fibroids, LLQ pain  Discharge Diagnoses:  Active Problems:   Endometriosis   Discharged Condition: good  Hospital Course: Patient admitted through same day surgery.  She was taken to OR where TLH/Bilateral salpingectomy, cystoscopy, LOA were performed.  Surgical findings included significant adhesions to the left sidewall and left anterior abdominal wall of the uterus.  Surgery was uneventful.  EBL 75cc.  Foley catheter was removed before leaving OR.  Patient transferred to PACU where she was stable and then to 3rd floor for the remainder of her hospitalization.  During her post-op recovery, her vitals and stable and she was AF.  In evening of POD#0, she was able to transition to oral pain medications and regular diet.  She was able to ambulate and she had good pain control.  She was also able to void on her own.  Patient seen both in the evening of POD#0 and AM of POD#1.  In the AM of POD#1, she was without complaint.  Post op hb was 12.0, decreased from 13.4, pre-operatively.  At this point, patient was voiding, walking, having excellent pain control, had no nausea, and minimal vaginal bleeding.  She was ready for D/C.  Consults: None  Significant Diagnostic Studies: labs: post op hb 12.0  Treatments: surgery: TLH, bilateral salpingectomy, cystoscopy, LOA  Discharge Exam: Blood pressure 104/62, pulse 82, temperature 98.6 F (37 C), temperature source Oral, resp. rate 16, height 5\' 1"  (1.549 m), weight 144 lb (65.3 kg), last menstrual period 12/29/2015, SpO2 100 %. General appearance: alert and cooperative Resp: clear to auscultation bilaterally Cardio: regular rate and rhythm, S1, S2 normal, no murmur, click, rub or gallop GI: soft, non-tender; bowel sounds normal; no masses,  no  organomegaly Extremities: extremities normal, atraumatic, no cyanosis or edema Incision/Wound:c/d/i  Disposition: 01-Home or Self Care     Medication List    TAKE these medications   ibuprofen 800 MG tablet Commonly known as:  ADVIL,MOTRIN Take 1 tablet (800 mg total) by mouth every 8 (eight) hours as needed.   naproxen sodium 220 MG tablet Commonly known as:  ALEVE Take 1 tablet (220 mg total) by mouth daily as needed (pain). Don't take while you are taking Motrin post operatively What changed:  additional instructions   niacin 500 MG tablet Take 500 mg by mouth at bedtime.   OVER THE COUNTER MEDICATION Place 1 drop into both eyes daily as needed (tired eyes). [employer] Tired Eyes eye drop   oxyCODONE-acetaminophen 5-325 MG tablet Commonly known as:  PERCOCET/ROXICET Take 2 tablets by mouth every 4 (four) hours as needed for severe pain.   VITAMIN C PO Take 1 tablet by mouth every morning.   ZINC PO Take 1 tablet by mouth every morning.      Follow-up Information    Lyman Speller, MD Follow up on 01/20/2016.   Specialty:  Gynecology Why:  appt time 2:30pm Contact information: [address] [place] [address] [phone]           Signed: Lyman Speller 01/14/2016, 10:12 AM

## 2016-01-14 NOTE — Discharge Instructions (Signed)

## 2016-01-14 NOTE — Progress Notes (Signed)
1 Day Post-Op Procedure(s) (LRB): HYSTERECTOMY TOTAL LAPAROSCOPIC WITH PERITONEAL BIOPSY (N/A) LAPAROSCOPIC BILATERAL SALPINGECTOMY (Bilateral) CYSTOSCOPY (N/A) LAPAROSCOPIC LYSIS OF ADHESIONS  Subjective: Patient reports excellent pain control.  Voiding without difficulty.  Ate regular breakfast.  Feels good.  Has walked halls.  Would like to go home!   Objective: I have reviewed patient's vital signs, intake and output, medications and labs. Vitals:   01/14/16 0426 01/14/16 0744  BP: (!) 109/56 104/62  Pulse: 82 82  Resp: 16 16  Temp: 98.7 F (37.1 C) 98.6 F (37 C)     General: alert and cooperative Resp: clear to auscultation bilaterally Cardio: regular rate and rhythm, S1, S2 normal, no murmur, click, rub or gallop GI: soft, non-tender; bowel sounds normal; no masses,  no organomegaly and incision: clean, dry and intact Extremities: extremities normal, atraumatic, no cyanosis or edema Vaginal Bleeding: minimal  Assessment: s/p Procedure(s): HYSTERECTOMY TOTAL LAPAROSCOPIC WITH PERITONEAL BIOPSY (N/A) LAPAROSCOPIC BILATERAL SALPINGECTOMY (Bilateral) CYSTOSCOPY (N/A) LAPAROSCOPIC LYSIS OF ADHESIONS: stable and progressing well  Plan: Discharge home  LOS: 0 days    Amy Curry 01/14/2016, 10:08 AM

## 2016-01-20 ENCOUNTER — Encounter: Payer: Self-pay | Admitting: Obstetrics & Gynecology

## 2016-01-20 ENCOUNTER — Ambulatory Visit (INDEPENDENT_AMBULATORY_CARE_PROVIDER_SITE_OTHER): Payer: BLUE CROSS/BLUE SHIELD | Admitting: Obstetrics & Gynecology

## 2016-01-20 VITALS — BP 124/80 | HR 88 | Resp 16 | Wt 140.0 lb

## 2016-01-20 DIAGNOSIS — Z9889 Other specified postprocedural states: Secondary | ICD-10-CM

## 2016-01-20 MED ORDER — OXYCODONE-ACETAMINOPHEN 5-325 MG PO TABS: 2.0000 | ORAL_TABLET | ORAL | 0 refills | Status: DC | PRN

## 2016-01-20 NOTE — Progress Notes (Signed)
Post Operative Visit  Procedure:HYSTERECTOMY TOTAL LAPAROSCOPIC WITH PERITONEAL BIOPSY (N/A Abdomen); LAPAROSCOPIC BILATERAL SALPINGECTOMY (Bilateral Abdomen); CYSTOSCOPY (N/A Urethra); LAPAROSCOPIC LYSIS OF ADHESIONS  Days Post-op: 7 days  Subjective: Reports she did have some constipation.  On Sunday, she did have a bowel movement.  Has stopped the colace.  Her bladder function feels normal.  Reports her belly button did have some bleeding on Friday.  Reports her right side has been more sore than the left.  Denies vaginal bleeding.  Still taking the motrin and the percocet.  She has not taken any today.    Objective: BP 124/80 (BP Location: Right Arm, Patient Position: Sitting, Cuff Size: Normal)   Pulse 88   Resp 16   Wt 140 lb (63.5 kg)   LMP 12/29/2015 (Exact Date)   BMI 26.45 kg/m   EXAM General: alert and cooperative Resp: clear to auscultation bilaterally Cardio: regular rate and rhythm, S1, S2 normal, no murmur, click, rub or gallop GI: soft, non-tender; bowel sounds normal; no masses,  no organomegaly and incision: clean, dry and intact Extremities: extremities normal, atraumatic, no cyanosis or edema Vaginal Bleeding: none  Assessment: s/p TLH/bilateral salpingectomy/cystoscopy.  Doing well.  Plan: Recheck 3 weeks.  Limitations reviewed. Rx for additional #20 Percocet given.  Pt still has six but feels she may need a few more than this.

## 2016-01-28 ENCOUNTER — Telehealth: Payer: Self-pay | Admitting: Obstetrics & Gynecology

## 2016-01-28 NOTE — Telephone Encounter (Signed)
Spoke with patient, advised per post-op instructions, ok to take tub bath after one week. Patient verbalizes understanding and is agreeable.  Routing to provider for final review. Patient is agreeable to disposition. Will close encounter.   Cc: Dr. Sabra Heck

## 2016-01-28 NOTE — Telephone Encounter (Signed)
Patient has a question about her after care from her hysterectomy.

## 2016-01-28 NOTE — Telephone Encounter (Addendum)
Spoke with patient. Patient states she had surgery 01/13/16 ([name] with peritoneal biopsy) with Dr. Sabra Heck, would like to know if she can take a tub bath. Patient was seen in office for 1 week post-op 01/20/16. Advised patient Dr. Sabra Heck is out of the office will speak with covering provider and return call. Patient is agreeable.  Spoke with Dr. Quincy Simmonds -was advised each physician is specific with post-op instructions, follow Dr. Sanjuan Dame post-op surgery instructions.

## 2016-02-05 NOTE — Progress Notes (Addendum)
Post Operative Visit  Procedure: HYSTERECTOMY TOTAL LAPAROSCOPIC WITH PERITONEAL BIOPSY,  LAPAROSCOPIC BILATERAL SALPINGECTOMY,  CYSTOSCOPY,  LAPAROSCOPIC LYSIS OF ADHESIONS Days Post-op: 23  Days   Subjective: Doing well.  Reports she is feeling much better.  Emptying bladder well.  Reports she is taking OTC ibuprofen and still taking Percocet.  She feels this has gotten worse over the last week.  This is LLQ vs RLQ but can be on both sides.  Pt states that if she rests, this will help.  Pain is sharp.  She is using herbal tea for constipation so she is having a bowel movements daily.  Denies vaginal bleeding.  She is having some discharge without odor.    Objective: BP 110/74 (BP Location: Right Arm, Patient Position: Sitting, Cuff Size: Normal)   Pulse 70   Resp 14   Ht 5' 0.75" (1.543 m)   Wt 139 lb (63 kg)   LMP 12/29/2015 (Exact Date)   BMI 26.48 kg/m   EXAM General: alert and cooperative Resp: clear to auscultation bilaterally Cardio: regular rate and rhythm, S1, S2 normal, no murmur, click, rub or gallop GI: soft, non-tender; bowel sounds normal; no masses,  no organomegaly and incision: clean, dry and intact Extremities: extremities normal, atraumatic, no cyanosis or edema  Gyn:  NAEFG, vaginal without lesions, no blood, cervix absent, cuff healing well, tenderness to pelvic floor R>L Vaginal Bleeding: none  Assessment: s/p TLH/bilateral salpingectomy, LOA  Plan: Recheck 2-3 weeks Refer to PT due to pelvic floor pain.  Will modify STD to 02/24/15 to initiate PT. Trial of flexeril 10mg  nightly, prn RF percocet 5/325 1-2 tabs po every 8 hrs prn.  #20/0RF.

## 2016-02-06 ENCOUNTER — Ambulatory Visit (INDEPENDENT_AMBULATORY_CARE_PROVIDER_SITE_OTHER): Payer: BLUE CROSS/BLUE SHIELD | Admitting: Obstetrics & Gynecology

## 2016-02-06 ENCOUNTER — Encounter: Payer: Self-pay | Admitting: Obstetrics & Gynecology

## 2016-02-06 ENCOUNTER — Telehealth: Payer: Self-pay | Admitting: *Deleted

## 2016-02-06 VITALS — BP 110/74 | HR 70 | Resp 14 | Ht 60.75 in | Wt 139.0 lb

## 2016-02-06 DIAGNOSIS — M6289 Other specified disorders of muscle: Secondary | ICD-10-CM

## 2016-02-06 MED ORDER — OXYCODONE-ACETAMINOPHEN 5-325 MG PO TABS: 2.0000 | ORAL_TABLET | ORAL | 0 refills | Status: DC | PRN

## 2016-02-06 MED ORDER — CYCLOBENZAPRINE HCL 10 MG PO TABS: ORAL_TABLET | ORAL | 0 refills | Status: DC

## 2016-02-06 NOTE — Addendum Note (Signed)
Addended by: Megan Salon on: 02/06/2016 03:34 PM   Modules accepted: Orders

## 2016-02-06 NOTE — Telephone Encounter (Signed)
Call to patient. Advised updated short term disability papers have been faxed and confirmation has been received.  See copy of forms scanned to chart.  Routing to provider for final review. Patient agreeable to disposition. Will close encounter.

## 2016-02-17 ENCOUNTER — Telehealth: Payer: Self-pay | Admitting: Obstetrics & Gynecology

## 2016-02-17 NOTE — Telephone Encounter (Signed)
Spoke with patient. Patient states she had surgery 01/13/16, TLH. Patients states she has noticed blood on tissue when wiping since yesterday. Patient reports she only sees small amounts with wiping and it is not every time. Patient reports mild menstrual like cramps. Patient denies any odor or fever and reports no intercourse. RN advised patient it is normal to have scant amounts of bleeding as she continues to heal. Recommended patient to continue to monitor. If bleeding becomes heavy, pain becomes severe, fever develops or odor with bleeding or discharge -return call to office. Advised patient will update Dr. Sabra Heck and return call with any additional recommendations, patient is agreeable.  Dr. Sabra Heck -any additional recommendations?

## 2016-02-17 NOTE — Telephone Encounter (Signed)
She will probably benefit from me taking a look.  She has been a little slower to recover than I expected.  Ok to scheduled appt.  Thanks.

## 2016-02-17 NOTE — Telephone Encounter (Signed)
Patient has a post op question for Dr.Miller.

## 2016-02-18 NOTE — Telephone Encounter (Signed)
Spoke with patient, advised as seen below per Dr. Sabra Heck. Patient states bleeding is still the same, only with wiping. Patient scheduled 02/19/16 at 10am with Dr. Sabra Heck. Patient verbalizes understanding and is agreeable to date and time.  Routing to provider for final review. Patient is agreeable to disposition. Will close encounter.

## 2016-02-19 ENCOUNTER — Encounter: Payer: Self-pay | Admitting: Obstetrics & Gynecology

## 2016-02-19 ENCOUNTER — Ambulatory Visit (INDEPENDENT_AMBULATORY_CARE_PROVIDER_SITE_OTHER): Payer: BLUE CROSS/BLUE SHIELD | Admitting: Obstetrics & Gynecology

## 2016-02-19 VITALS — BP 118/64 | HR 98 | Resp 14 | Ht 61.0 in | Wt 139.6 lb

## 2016-02-19 DIAGNOSIS — N939 Abnormal uterine and vaginal bleeding, unspecified: Secondary | ICD-10-CM

## 2016-02-19 DIAGNOSIS — M6289 Other specified disorders of muscle: Secondary | ICD-10-CM

## 2016-02-19 NOTE — Progress Notes (Signed)
Post Operative Visit  Procedure:TLH/bilateral salpingectomy, cystoscopy Surgery date:  01/13/16  Subjective: Pt reports seeing some vaginal bleeding with wiping this week.  No pain.  Reports pelvic floor pain has improved with flexeril and starting back some exercise.  Has not been called PT.  Will check about referral.  Has stopped narcotics now about a week.  Thinks the flexeril has helped a lot.  Interested in how much PT will help as well.  Feels some of this pain is chronic in nature now that post op pain is much improved.  Voiding easily.  No constipation.  No fevers.  Having normal BMs.  Objective: BP 118/64 (BP Location: Right Arm, Patient Position: Sitting, Cuff Size: Normal)   Pulse 98   Resp 14   Ht 5\' 1"  (1.549 m)   Wt 139 lb 9.6 oz (63.3 kg)   LMP 12/29/2015 (Exact Date)   BMI 26.38 kg/m   EXAM General: alert and cooperative GI: soft, non-tender; bowel sounds normal; no masses,  no organomegaly and incision: clean, dry and intact Extremities: extremities normal, atraumatic, no cyanosis or edema Vaginal Bleeding: minimal  GYN:  NAEFG, vaginal without lesions, cuff well approximated with scan blood present.  Completed intact and probed without any abnormal findings.  Pt comfortable with this exam  Assessment: s/p TLH/bilateral salpingectomy, cystoscopy Pelvic floor pain Vaginal bleeding, no evidence of infection or cuff issus  Plan: Recheck 3 weeks Note for return to work given

## 2016-03-01 ENCOUNTER — Telehealth: Payer: Self-pay | Admitting: Obstetrics & Gynecology

## 2016-03-01 NOTE — Telephone Encounter (Signed)
Call to patient to notify [facility] attempting to schedule referral appointment for pelvic physical therapy. Left voicemail to return call for information.

## 2016-03-02 NOTE — Telephone Encounter (Signed)
Thanks for the update.  Ok to close referral and encounter.

## 2016-03-02 NOTE — Telephone Encounter (Signed)
Closing to close per provider

## 2016-03-02 NOTE — Telephone Encounter (Signed)
Patient returned call. Advised patient [facility] has been attempting to contact her in order to scheduled an appointment for pelvic physical therapy.  Patient states she is not aware of any calls from [facility].  I have provided the patient with the phone number to [facility], patient states she has been a patient with [facility] in the past and she will contact their office for scheduling.  Routing to Dr Sabra Heck

## 2016-03-09 DIAGNOSIS — J019 Acute sinusitis, unspecified: Secondary | ICD-10-CM | POA: Diagnosis not present

## 2016-03-11 ENCOUNTER — Encounter: Payer: Self-pay | Admitting: Obstetrics & Gynecology

## 2016-03-11 ENCOUNTER — Ambulatory Visit (INDEPENDENT_AMBULATORY_CARE_PROVIDER_SITE_OTHER): Payer: BLUE CROSS/BLUE SHIELD | Admitting: Obstetrics & Gynecology

## 2016-03-11 VITALS — BP 130/76 | HR 94 | Resp 14 | Ht 61.0 in | Wt 136.8 lb

## 2016-03-11 DIAGNOSIS — M6289 Other specified disorders of muscle: Secondary | ICD-10-CM | POA: Diagnosis not present

## 2016-03-11 NOTE — Progress Notes (Signed)
GYNECOLOGY  VISIT   HPI: 51 y.o. G65P2002 Married Caucasian female here for follow up after experiencing significant pelvic floor pain that seemed to worsen or become more apparent after her hysterectomy on 01/13/16.  Denies vaginal bleeding.  Has stopped taking Flexeril.  Reports pain is only intermittent now.  Doing much better.  Seeing PT on Tuesday.    Denies any bladder function issues.  Bowel function is back to normal.  Feeling much better.  Off all narcotics.  GYNECOLOGIC HISTORY: Patient's last menstrual period was 12/29/2015 (exact date). Contraception: hysterectomy Menopausal hormone therapy: none  Patient Active Problem List   Diagnosis Date Noted  . Nephrolithiasis 12/06/2014    Past Medical History:  Diagnosis Date  . 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 Headache    Migraines  . History of kidney stones    10 years with uti's  . Kidney stones     Past Surgical History:  Procedure Laterality Date  . CESAREAN SECTION  x2  . CYSTOSCOPY N/A 01/13/2016   Procedure: CYSTOSCOPY;  Surgeon: Megan Salon, MD;  Location: [facility];  Service: Gynecology;  Laterality: N/A;  . LAPAROSCOPIC BILATERAL SALPINGECTOMY Bilateral 01/13/2016   Procedure: LAPAROSCOPIC BILATERAL SALPINGECTOMY;  Surgeon: Megan Salon, MD;  Location: [facility];  Service: Gynecology;  Laterality: Bilateral;  . LAPAROSCOPIC HYSTERECTOMY N/A 01/13/2016   Procedure: HYSTERECTOMY TOTAL LAPAROSCOPIC WITH PERITONEAL BIOPSY;  Surgeon: Megan Salon, MD;  Location: [facility];  Service: Gynecology;  Laterality: N/A;  . LAPAROSCOPIC LYSIS OF ADHESIONS  01/13/2016   Procedure: LAPAROSCOPIC LYSIS OF ADHESIONS;  Surgeon: Megan Salon, MD;  Location: [facility];  Service: Gynecology;;  . LITHOTRIPSY  7/16    MEDS:  Reviewed in EPIC and UTD  ALLERGIES: Patient has no known allergies.  Family History  Problem Relation Age of Onset  . Diabetes Father 22    SH:  married  Review of Systems  All other systems reviewed  and are negative.   PHYSICAL EXAMINATION:    BP 130/76 (BP Location: Right Arm, Patient Position: Sitting, Cuff Size: Normal)   Pulse 94   Resp 14   Ht 5\' 1"  (1.549 m)   Wt 136 lb 12.8 oz (62.1 kg)   LMP 12/29/2015 (Exact Date)   BMI 25.85 kg/m     General appearance: alert, cooperative and appears stated age Abdomen: soft, non-tender; bowel sounds normal; no masses,  no organomegaly Inc:  D/C/I  Pelvic: External genitalia:  no lesions              Urethra:  normal appearing urethra with no masses, tenderness or lesions              Bartholins and Skenes: normal                 Vagina: normal appearing vagina with normal color and discharge, no lesions, cuff healing well without erythema or blood              Cervix: no lesions              Bimanual Exam:  Uterus:  uterus absent, no masses or fullness              Adnexa: no mass, fullness, tenderness              Anus:  No lesions  Assessment: S/P TLH/bilateral salpingectomy/cystoscopy due to fibroids, menorrhagia and pelvic adhesions Pelvic floor pain that is still persistent  but improved  Plan: Pt will see Ileana Roup next week.  Has follow up AEX with Debbi Hollice Espy 10/18.

## 2016-03-16 DIAGNOSIS — M62838 Other muscle spasm: Secondary | ICD-10-CM | POA: Diagnosis not present

## 2016-03-16 DIAGNOSIS — M545 Low back pain: Secondary | ICD-10-CM | POA: Diagnosis not present

## 2016-03-16 DIAGNOSIS — R102 Pelvic and perineal pain: Secondary | ICD-10-CM | POA: Diagnosis not present

## 2016-03-16 DIAGNOSIS — M6281 Muscle weakness (generalized): Secondary | ICD-10-CM | POA: Diagnosis not present

## 2016-03-31 DIAGNOSIS — M6281 Muscle weakness (generalized): Secondary | ICD-10-CM | POA: Diagnosis not present

## 2016-03-31 DIAGNOSIS — M62838 Other muscle spasm: Secondary | ICD-10-CM | POA: Diagnosis not present

## 2016-03-31 DIAGNOSIS — R1031 Right lower quadrant pain: Secondary | ICD-10-CM | POA: Diagnosis not present

## 2016-03-31 DIAGNOSIS — R102 Pelvic and perineal pain: Secondary | ICD-10-CM | POA: Diagnosis not present

## 2016-06-22 DIAGNOSIS — R197 Diarrhea, unspecified: Secondary | ICD-10-CM | POA: Diagnosis not present

## 2016-06-22 DIAGNOSIS — N3001 Acute cystitis with hematuria: Secondary | ICD-10-CM | POA: Diagnosis not present

## 2016-06-22 DIAGNOSIS — E860 Dehydration: Secondary | ICD-10-CM | POA: Diagnosis not present

## 2016-06-22 DIAGNOSIS — R1012 Left upper quadrant pain: Secondary | ICD-10-CM | POA: Diagnosis not present

## 2016-07-15 ENCOUNTER — Ambulatory Visit (INDEPENDENT_AMBULATORY_CARE_PROVIDER_SITE_OTHER): Payer: BLUE CROSS/BLUE SHIELD | Admitting: Obstetrics & Gynecology

## 2016-07-15 ENCOUNTER — Telehealth: Payer: Self-pay | Admitting: Obstetrics & Gynecology

## 2016-07-15 ENCOUNTER — Ambulatory Visit (INDEPENDENT_AMBULATORY_CARE_PROVIDER_SITE_OTHER): Payer: BLUE CROSS/BLUE SHIELD

## 2016-07-15 ENCOUNTER — Other Ambulatory Visit: Payer: Self-pay | Admitting: Obstetrics & Gynecology

## 2016-07-15 VITALS — BP 112/80 | HR 80 | Resp 16 | Ht 61.0 in | Wt 124.0 lb

## 2016-07-15 DIAGNOSIS — M6289 Other specified disorders of muscle: Secondary | ICD-10-CM | POA: Diagnosis not present

## 2016-07-15 DIAGNOSIS — R1032 Left lower quadrant pain: Secondary | ICD-10-CM

## 2016-07-15 DIAGNOSIS — F3281 Premenstrual dysphoric disorder: Secondary | ICD-10-CM | POA: Diagnosis not present

## 2016-07-15 MED ORDER — FLUOXETINE HCL 10 MG PO TABS: 10.0000 mg | ORAL_TABLET | Freq: Every day | ORAL | 1 refills | Status: DC

## 2016-07-15 MED ORDER — CYCLOBENZAPRINE HCL 10 MG PO TABS: 10.0000 mg | ORAL_TABLET | Freq: Every day | ORAL | 0 refills | Status: DC

## 2016-07-15 NOTE — Telephone Encounter (Signed)
Patient having some pain on her left side and not sure if it's related to her ovaries.

## 2016-07-15 NOTE — Progress Notes (Signed)
GYNECOLOGY  VISIT   HPI: 51 y.o. G38P2002 Married Caucasian female here for complaint of new onset of LLQ pain.  She reports she is also having increased PMS symptoms.  Reports this feels like and emotional swing and she feels like she has rage and then it will go away.  This is associated with her breast pain.  Takes Midol PMS but this doesn't really help.  She reports she self medicate with "a few beers" and soaks in the tub and then it is "ok".  This month, the symptoms started June 2 or 3.   Pt has persistent post op pain that was determined to be pelvic floor related.  She was referred to pelvic PT but reports she only went to three visits as pain improved and as she felt she could do a lot of the exercises without instruction.  She reports there are still some sexual positions that are very uncomfortable to her and she'd like to get to the bottom of this as well.   Hysterectomy was 01/13/16.  GYNECOLOGIC HISTORY: Patient's last menstrual period was 12/29/2015 (exact date). Contraception: hysterectomy Menopausal hormone therapy: none  Patient Active Problem List   Diagnosis Date Noted  . Nephrolithiasis 12/06/2014    Past Medical History:  Diagnosis Date  . 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 Headache    Migraines  . History of kidney stones    10 years with uti's  . Kidney stones     Past Surgical History:  Procedure Laterality Date  . CESAREAN SECTION  x2  . CYSTOSCOPY N/A 01/13/2016   Procedure: CYSTOSCOPY;  Surgeon: Megan Salon, MD;  Location: [facility];  Service: Gynecology;  Laterality: N/A;  . LAPAROSCOPIC BILATERAL SALPINGECTOMY Bilateral 01/13/2016   Procedure: LAPAROSCOPIC BILATERAL SALPINGECTOMY;  Surgeon: Megan Salon, MD;  Location: [facility];  Service: Gynecology;  Laterality: Bilateral;  . LAPAROSCOPIC HYSTERECTOMY N/A 01/13/2016   Procedure: HYSTERECTOMY TOTAL LAPAROSCOPIC WITH PERITONEAL BIOPSY;  Surgeon: Megan Salon, MD;  Location: [facility];  Service:  Gynecology;  Laterality: N/A;  . LAPAROSCOPIC LYSIS OF ADHESIONS  01/13/2016   Procedure: LAPAROSCOPIC LYSIS OF ADHESIONS;  Surgeon: Megan Salon, MD;  Location: [facility];  Service: Gynecology;;  . LITHOTRIPSY  7/16    MEDS:  Reviewed in EPIC and UTD  ALLERGIES: Patient has no known allergies.  Family History  Problem Relation Age of Onset  . Diabetes Father 75    SH:  Married, non smoker  Review of Systems  Constitutional: Negative.   Respiratory: Negative.   Cardiovascular: Negative.   Gastrointestinal: Positive for abdominal pain (LLQ abdominal/pelvic pain).  Genitourinary: Negative.   Musculoskeletal: Negative.     PHYSICAL EXAMINATION:    BP 112/80 (BP Location: Right Arm, Patient Position: Sitting, Cuff Size: Normal)   Pulse 80   Resp 16   Ht 5\' 1"  (1.549 m)   Wt 124 lb (56.2 kg)   LMP 12/29/2015 (Exact Date)   BMI 23.43 kg/m     General appearance: alert, cooperative and appears stated age CV:  Regular rate and rhythm Lungs:  clear to auscultation, no wheezes, rales or rhonchi, symmetric air entry Abdomen: soft, non-tender; bowel sounds normal; no masses,  no organomegaly  Pelvic: External genitalia:  no lesions              Urethra:  normal appearing urethra with no masses, tenderness or lesions  Bartholins and Skenes: normal                 Vagina: normal appearing vagina with normal color and discharge, no lesions              Cervix: absent              Bimanual Exam:  Uterus:  uterus absent              Adnexa: no mass, fullness, tenderness              Anus:   no lesions  Pelvic floor tenderness left >> right.  This is similar pt as pt was experiencing post operatively.  Chaperone was present for exam.  Assessment: PMDD? Symptoms vs emotional lability LLQ pain Left pelvic floor tendernss  Plan: Pt and I have discussed PMDD treatments in the past.  Reviewed this today. Will start with prozac 10mg  daily.  Rx to pharmacy.  She is going  to give update in two weeks.  Plan follow up 4-6 weeks. Flexeril 10mg  nightly for next week to see if this will help pain as well Referral to pelvic PT will be made again.   ~30 minutes spent with patient >50% of time was in face to face discussion of above.

## 2016-07-15 NOTE — Telephone Encounter (Signed)
Spoke with patient. Patient reports LLQ pain that started  A few days ago, reports as intermittent, sharp, shooting and aches. Reports pain is the same as she was having prior to hysterectomy, is unable to get any relief. Denies urinary complains, bleeding, nausea/vomiting, fever. Reports PMS symptoms have worsened over last few months, increased emotions. Recommended OV for further evaluation, patient scheduled for today at 3:30pm with Dr. Sabra Heck. Patient is agreeable to date and time. Advised patient will review with Dr. Sabra Heck and return call with any additional recommendations, patient is agreeable.   Routing to provider for final review. Patient is agreeable to disposition. Will close encounter.

## 2016-07-18 ENCOUNTER — Encounter: Payer: Self-pay | Admitting: Obstetrics & Gynecology

## 2016-07-18 DIAGNOSIS — M6289 Other specified disorders of muscle: Secondary | ICD-10-CM | POA: Insufficient documentation

## 2016-07-18 DIAGNOSIS — F3281 Premenstrual dysphoric disorder: Secondary | ICD-10-CM | POA: Insufficient documentation

## 2016-08-03 DIAGNOSIS — N3000 Acute cystitis without hematuria: Secondary | ICD-10-CM | POA: Diagnosis not present

## 2016-08-03 DIAGNOSIS — R300 Dysuria: Secondary | ICD-10-CM | POA: Diagnosis not present

## 2016-08-10 ENCOUNTER — Other Ambulatory Visit: Payer: Self-pay | Admitting: Obstetrics & Gynecology

## 2016-08-10 NOTE — Telephone Encounter (Signed)
Medication refill request: Flexeril  Last AEX:  11-11-15  Next AEX: 11-11-16 Last MMG (if hormonal medication request): 12-11-14 WNL  Refill authorized: please advise

## 2016-09-07 ENCOUNTER — Other Ambulatory Visit: Payer: Self-pay | Admitting: Obstetrics & Gynecology

## 2016-09-07 NOTE — Telephone Encounter (Signed)
Medication refill request: Prozac Last AEX:  11-11-15 Next AEX: 11-11-16 Last MMG (if hormonal medication request): 12-11-14 WNL  Refill authorized: please advise

## 2016-09-17 ENCOUNTER — Ambulatory Visit (INDEPENDENT_AMBULATORY_CARE_PROVIDER_SITE_OTHER): Payer: BLUE CROSS/BLUE SHIELD | Admitting: Obstetrics & Gynecology

## 2016-09-17 ENCOUNTER — Encounter: Payer: Self-pay | Admitting: Obstetrics & Gynecology

## 2016-09-17 VITALS — BP 120/70 | HR 64 | Resp 16 | Ht 61.0 in | Wt 123.0 lb

## 2016-09-17 DIAGNOSIS — F3281 Premenstrual dysphoric disorder: Secondary | ICD-10-CM

## 2016-09-17 DIAGNOSIS — G4700 Insomnia, unspecified: Secondary | ICD-10-CM | POA: Diagnosis not present

## 2016-09-17 DIAGNOSIS — M6289 Other specified disorders of muscle: Secondary | ICD-10-CM | POA: Diagnosis not present

## 2016-09-17 MED ORDER — FLUOXETINE HCL 10 MG PO TABS: 10.0000 mg | ORAL_TABLET | Freq: Every day | ORAL | 4 refills | Status: DC

## 2016-09-17 MED ORDER — TEMAZEPAM 15 MG PO CAPS: 15.0000 mg | ORAL_CAPSULE | Freq: Every evening | ORAL | 0 refills | Status: DC | PRN

## 2016-09-17 NOTE — Progress Notes (Signed)
GYNECOLOGY  VISIT   HPI: 51 y.o. G38P2002 Married Caucasian female here for follow-up.  Reports still having some LLQ pain that does seem to have improvement and exacerbation given certain exercises and movement.  She feels this is less and less related to surgery.  Has not done pelvic PT yet.  Not really interested in at this time.  Had a UTI in late June.  Was initially prescribed Macrobid and this was intermediate sensitive.  Was changed to Ciprofloxin after culture came back.  She took this for seven.  Symptoms have fully resolved.  Now using cranberry tablets for prevention.    Denies vaginal bleeding.    Asked if satisfied with surgery and she reports she really is happy she did the surgery.  Having no bleeding and no menstrual related pain or other symptoms.  Reports no regrets.    Feels much more even with the 10mg  Prozac.  Would like to stay on it.  Feels this has really helped.  Also, using Flexeril nightly.  Really helps with sleep.  D/w pt this is not a good medication to use for this.  She would like to consider another option for insomnia treatment.  Options discussed.  Would like to try Restoril.   GYNECOLOGIC HISTORY: Patient's last menstrual period was 12/29/2015 (exact date). Contraception: hysterectomy Menopausal hormone therapy: none  Patient Active Problem List   Diagnosis Date Noted  . Pelvic floor dysfunction 07/18/2016  . PMDD (premenstrual dysphoric disorder) 07/18/2016  . Nephrolithiasis 12/06/2014    Past Medical History:  Diagnosis Date  . 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 Headache    Migraines  . History of kidney stones    10 years with uti's  . Kidney stones     Past Surgical History:  Procedure Laterality Date  . CESAREAN SECTION  x2  . CYSTOSCOPY N/A 01/13/2016   Procedure: CYSTOSCOPY;  Surgeon: Megan Salon, MD;  Location: [facility];  Service: Gynecology;  Laterality: N/A;  . LAPAROSCOPIC BILATERAL SALPINGECTOMY Bilateral 01/13/2016    Procedure: LAPAROSCOPIC BILATERAL SALPINGECTOMY;  Surgeon: Megan Salon, MD;  Location: [facility];  Service: Gynecology;  Laterality: Bilateral;  . LAPAROSCOPIC HYSTERECTOMY N/A 01/13/2016   Procedure: HYSTERECTOMY TOTAL LAPAROSCOPIC WITH PERITONEAL BIOPSY;  Surgeon: Megan Salon, MD;  Location: [facility];  Service: Gynecology;  Laterality: N/A;  . LAPAROSCOPIC LYSIS OF ADHESIONS  01/13/2016   Procedure: LAPAROSCOPIC LYSIS OF ADHESIONS;  Surgeon: Megan Salon, MD;  Location: [facility];  Service: Gynecology;;  . LITHOTRIPSY  7/16    MEDS:   Current Outpatient Prescriptions on File Prior to Visit  Medication Sig Dispense Refill  . cyclobenzaprine (FLEXERIL) 10 MG tablet TAKE 1 TABLET BY MOUTH EVERYDAY AT BEDTIME 30 tablet 0  . ibuprofen (ADVIL,MOTRIN) 800 MG tablet Take 1 tablet (800 mg total) by mouth every 8 (eight) hours as needed. 30 tablet 0  . niacin 500 MG tablet Take 500 mg by mouth at bedtime.    . Pseudoephedrine HCl (SUDAFED PO) Take by mouth as needed.      No current facility-administered medications on file prior to visit.      ALLERGIES: Patient has no known allergies.  Family History  Problem Relation Age of Onset  . Diabetes Father 45    SH:  Married, non smoker  ROS  PHYSICAL EXAMINATION:    BP 120/70   Pulse 64   Resp 16   Ht 5\' 1"  (1.549 m)  Wt 123 lb (55.8 kg)   LMP 12/29/2015 (Exact Date)   BMI 23.24 kg/m     General appearance: alert, cooperative and appears stated age Abdomen: soft, non-tender; bowel sounds normal; no masses,  no organomegaly  Pelvic: External genitalia:  no lesions              Urethra:  normal appearing urethra with no masses, tenderness or lesions              Bartholins and Skenes: normal                 Vagina: normal appearing vagina with normal color and discharge, no lesions              Cervix: absent              Bimanual Exam:  Uterus:  uterus absent              Adnexa: no mass, fullness, tenderness  Chaperone was  present for exam.  Assessment: Intermittent LLQ pain Emotional lability, improved with Prozac Insomnia  Plan: Declines referral to PT at this time Will try restoril 15mg  nightly, prn.  #30/0RF.  Pt will give update. Follow-up for AEX   ~20 minutes spent with patient >50% of time was in face to face discussion of above.

## 2016-09-20 DIAGNOSIS — G4700 Insomnia, unspecified: Secondary | ICD-10-CM | POA: Insufficient documentation

## 2016-09-20 MED ORDER — FLUOXETINE HCL 10 MG PO TABS: 10.0000 mg | ORAL_TABLET | Freq: Every day | ORAL | 4 refills | Status: DC

## 2016-09-20 NOTE — Telephone Encounter (Signed)
90 day supply request from [facility]. 90-day supply sent to [facility] on 09/17/16.  RX to [facility] cancelled with [place] and re-sent to [facility].  Routing to provider for final review. Closing encounter.

## 2016-10-26 ENCOUNTER — Telehealth: Payer: Self-pay | Admitting: Obstetrics & Gynecology

## 2016-10-26 NOTE — Telephone Encounter (Signed)
Spoke with patient. Reports continuous LLQ that has been ongoing, now become more unbearable in the last 2 days. "Coinsides with breast tenderness, unsure if dpain r/t PMDD". Currently 3-4/10, taking Ibuprofen 400 mg PRN with no relief. Took flexeril 10 mg last night. "helped with sleeping".  No longer taking restoril, "did not work well".  Denies urinary symptoms, fever/chills, N/V, bleeding.  Recommended OV for further evaluation, scheduled with Dr. Sabra Heck on 10/28/16 at 8:30am. Advised patient would review with Dr. Sabra Heck and return call with any additional recommendations, patient is agreeable.   Dr. Sabra Heck- any additional recommendations?

## 2016-10-26 NOTE — Telephone Encounter (Signed)
Patient left voicemail requesting to speak with a nurse.  No other information given.

## 2016-10-27 NOTE — Telephone Encounter (Signed)
No additional recommendations.  Will assess and then decide what is next.  Thanks.  Ok to close encounter.

## 2016-10-28 ENCOUNTER — Encounter: Payer: Self-pay | Admitting: Obstetrics & Gynecology

## 2016-10-28 ENCOUNTER — Ambulatory Visit (INDEPENDENT_AMBULATORY_CARE_PROVIDER_SITE_OTHER): Payer: BLUE CROSS/BLUE SHIELD | Admitting: Obstetrics & Gynecology

## 2016-10-28 ENCOUNTER — Ambulatory Visit (INDEPENDENT_AMBULATORY_CARE_PROVIDER_SITE_OTHER): Payer: BLUE CROSS/BLUE SHIELD

## 2016-10-28 VITALS — BP 108/58 | HR 86 | Resp 14 | Wt 124.0 lb

## 2016-10-28 DIAGNOSIS — R4586 Emotional lability: Secondary | ICD-10-CM

## 2016-10-28 DIAGNOSIS — N83202 Unspecified ovarian cyst, left side: Secondary | ICD-10-CM | POA: Diagnosis not present

## 2016-10-28 DIAGNOSIS — R1032 Left lower quadrant pain: Secondary | ICD-10-CM

## 2016-10-28 NOTE — Progress Notes (Signed)
GYNECOLOGY  VISIT  CC:   LLQ pain  HPI: 51 y.o. G52P2002 Married Caucasian female here for complaint of LLQ pain.  Reports she had breast tenderness late last weekend which is now better.  Started aching over the weekend.  Monday and Tuesday were bad.  Pain was much more severe and sharp.  Today it is much better and now just a dull ache.  Had mild nausea.  No bowel or bladder issues and now fevers.  Stopped Prozac because this was causing anorgasmia.  Has been off a little more than a week.  Would like to consider something else but wants to be sure that her orgasms are "back to normal" before initiating any new medication.  Has not attempted intercourse due to the pain.  Feeling a little more emotionally labile which is the reason she started the Prozac in the first place.  Has been taking ibuprofen for the pain but did not take anything today due to improvement.  GYNECOLOGIC HISTORY: Patient's last menstrual period was 12/29/2015 (exact date). Contraception: hysterectomy Menopausal hormone therapy: none  Patient Active Problem List   Diagnosis Date Noted  . Insomnia 09/20/2016  . Pelvic floor dysfunction 07/18/2016  . PMDD (premenstrual dysphoric disorder) 07/18/2016  . Nephrolithiasis 12/06/2014    Past Medical History:  Diagnosis Date  . 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 Headache    Migraines  . History of kidney stones    10 years with uti's  . Kidney stones     Past Surgical History:  Procedure Laterality Date  . CESAREAN SECTION  x2  . CYSTOSCOPY N/A 01/13/2016   Procedure: CYSTOSCOPY;  Surgeon: Megan Salon, MD;  Location: [facility];  Service: Gynecology;  Laterality: N/A;  . LAPAROSCOPIC BILATERAL SALPINGECTOMY Bilateral 01/13/2016   Procedure: LAPAROSCOPIC BILATERAL SALPINGECTOMY;  Surgeon: Megan Salon, MD;  Location: [facility];  Service: Gynecology;  Laterality: Bilateral;  . LAPAROSCOPIC HYSTERECTOMY N/A 01/13/2016   Procedure: HYSTERECTOMY TOTAL  LAPAROSCOPIC WITH PERITONEAL BIOPSY;  Surgeon: Megan Salon, MD;  Location: [facility];  Service: Gynecology;  Laterality: N/A;  . LAPAROSCOPIC LYSIS OF ADHESIONS  01/13/2016   Procedure: LAPAROSCOPIC LYSIS OF ADHESIONS;  Surgeon: Megan Salon, MD;  Location: [facility];  Service: Gynecology;;  . LITHOTRIPSY  7/16    MEDS:   Current Outpatient Prescriptions on File Prior to Visit  Medication Sig Dispense Refill  . cyclobenzaprine (FLEXERIL) 10 MG tablet TAKE 1 TABLET BY MOUTH EVERYDAY AT BEDTIME 30 tablet 0  . ibuprofen (ADVIL,MOTRIN) 800 MG tablet Take 1 tablet (800 mg total) by mouth every 8 (eight) hours as needed. 30 tablet 0  . niacin 500 MG tablet Take 500 mg by mouth at bedtime.    . Pseudoephedrine HCl (SUDAFED PO) Take by mouth as needed.      No current facility-administered medications on file prior to visit.     ALLERGIES: Patient has no known allergies.  Family History  Problem Relation Age of Onset  . Diabetes Father 56    SH:  Married, smoker  Review of Systems  Constitutional: Negative.   Cardiovascular: Negative.   Gastrointestinal: Positive for abdominal pain (LLQ pain). Negative for blood in stool, constipation, diarrhea, heartburn, melena, nausea and vomiting.  Genitourinary: Negative.   Neurological: Negative.   Psychiatric/Behavioral: Negative.     PHYSICAL EXAMINATION:    BP (!) 108/58 (BP Location: Right Arm, Patient Position: Sitting, Cuff Size: Normal)   Pulse 86  Resp 14   Wt 124 lb (56.2 kg)   LMP 12/29/2015 (Exact Date)   BMI 23.43 kg/m     General appearance: alert, cooperative and appears stated age CV:  Regular rate and rhythm Lungs:  clear to auscultation, no wheezes, rales or rhonchi, symmetric air entry Abdomen: soft, mild LLQ tenderness, no rebound or guarding, non surgical abdomen; bowel sounds normal; no masses,  no organomegaly  Pelvic: External genitalia:  no lesions              Urethra:  normal appearing urethra with no masses,  tenderness or lesions              Bartholins and Skenes: normal                 Vagina: normal appearing vagina with normal color and discharge, no lesions              Cervix: absent              Bimanual Exam:  Uterus:  uterus absent              Adnexa: positive for tenderness of left adnexa/ovary, right without pain or fullness              Rectovaginal: Yes.  .  Confirms.              Anus:  normal sphincter tone, no lesions  Chaperone was present for exam. Recommended proceeding with ultrasound which could be added onto the schedule today.  Ultrasound results: Uterus and cervix surgically absent. Left ovary:  2.3 x 2.2 x 1.7cm with 1.5cm hemorrhagic corpus luteal cyst, avascular Right ovary: 2.3 x 1.2 x 1.2cm with collapsed corpus luteum and follicles present No free fluid in pelvis  Findings reviewed with pt.  Assessment: Left hemorrhagic cyst which explains acute increased LLQ pain Emotional lability Anorgasmia likely due to medication  Plan: Self limiting nature of hemorrhagic corpus luteal cyst discussed.  Treatment with anti-inflammatories is appropriate.  Typical time to full resolution discussed. Pt will call once resume SA and notes that anorgasmia has resolved.  Additional options for treatment discussed.  Pt aware will try another SSRI and that same side effect is possible but she is desirous of trying another if possible.   ~30 minutes spent with patient >50% of time was in face to face discussion of above.

## 2016-11-02 DIAGNOSIS — M545 Low back pain: Secondary | ICD-10-CM | POA: Diagnosis not present

## 2016-11-02 DIAGNOSIS — R1084 Generalized abdominal pain: Secondary | ICD-10-CM | POA: Diagnosis not present

## 2016-11-02 DIAGNOSIS — N2889 Other specified disorders of kidney and ureter: Secondary | ICD-10-CM | POA: Diagnosis not present

## 2016-11-10 ENCOUNTER — Telehealth: Payer: Self-pay | Admitting: Obstetrics & Gynecology

## 2016-11-10 NOTE — Telephone Encounter (Signed)
Patient called requesting to speak with the nurse about continued lower left side pain.  Last seen: 10/28/16

## 2016-11-10 NOTE — Telephone Encounter (Signed)
Patient returning your call.

## 2016-11-10 NOTE — Telephone Encounter (Signed)
Spoke with patient. Patient states she was seen by Dr. Sabra Heck on 9/20 and had PUS for LLQ pain, showed an ovarian cyst. States she was doing well after, but "same exact pain has returned."   Reports LLQ pain 2/10, started this morning. Ocassional sharp pain. Denies fever, nausea, vomiting.   Taking motrin 400 mg " every few hours", no relief.   Asking when pain will go away? any further treatment?   Advised patient Dr. Sabra Heck is out of the office today, will review with covering provider and return call with recommendations, patient is agreeable.   Dr. Nelson Chimes -please review and advise?  Cc: Dr. Sabra Heck

## 2016-11-10 NOTE — Telephone Encounter (Signed)
We are happy to see her in the office today or tomorrow if she is worried.  She can also take 800 mg of ibuprofen every 8 hours and be seen if her pain worsens or doesn't improve in the next few days.

## 2016-11-10 NOTE — Telephone Encounter (Signed)
Returned call to patient to advise of motrin as seen below, left detailed message ,ok per current dpr. Advised to return call to office with any additional questions.  Will close encounter.

## 2016-11-10 NOTE — Telephone Encounter (Signed)
Left message to call Amy Curry at [phone].  

## 2016-11-10 NOTE — Telephone Encounter (Signed)
Reviewed with Dr. Talbert Nan, may schedule OV for today, may also f/u with Dr. Sabra Heck.   Spoke with patient. Patient declined OV for today, will further discuss with Dr. Sabra Heck at next AEX dated 11/26/16. Patient aware to return call if fever, N/V, chills develop or if pain worsens. Aware to seek care at [facility] if after hours. Patient verbalizes understanding and is agreeable.   Routing to provider for final review. Patient is agreeable to disposition. Will close encounter.

## 2016-11-11 ENCOUNTER — Ambulatory Visit: Payer: BLUE CROSS/BLUE SHIELD | Admitting: Certified Nurse Midwife

## 2016-11-12 DIAGNOSIS — R1084 Generalized abdominal pain: Secondary | ICD-10-CM | POA: Diagnosis not present

## 2016-11-12 DIAGNOSIS — N200 Calculus of kidney: Secondary | ICD-10-CM | POA: Diagnosis not present

## 2016-11-26 ENCOUNTER — Ambulatory Visit: Payer: BLUE CROSS/BLUE SHIELD | Admitting: Obstetrics & Gynecology

## 2016-12-03 DIAGNOSIS — J029 Acute pharyngitis, unspecified: Secondary | ICD-10-CM | POA: Diagnosis not present

## 2016-12-21 DIAGNOSIS — Z1231 Encounter for screening mammogram for malignant neoplasm of breast: Secondary | ICD-10-CM | POA: Diagnosis not present

## 2016-12-24 DIAGNOSIS — R300 Dysuria: Secondary | ICD-10-CM | POA: Diagnosis not present

## 2017-01-07 ENCOUNTER — Ambulatory Visit: Payer: BLUE CROSS/BLUE SHIELD | Admitting: Obstetrics & Gynecology

## 2017-03-01 ENCOUNTER — Encounter: Payer: Self-pay | Admitting: Certified Nurse Midwife

## 2017-05-02 ENCOUNTER — Encounter: Payer: Self-pay | Admitting: Obstetrics & Gynecology

## 2017-05-02 ENCOUNTER — Ambulatory Visit: Payer: BLUE CROSS/BLUE SHIELD | Admitting: Obstetrics & Gynecology

## 2017-05-02 ENCOUNTER — Other Ambulatory Visit: Payer: Self-pay

## 2017-05-02 VITALS — BP 112/70 | HR 88 | Resp 16 | Ht 60.75 in | Wt 128.0 lb

## 2017-05-02 DIAGNOSIS — Z01419 Encounter for gynecological examination (general) (routine) without abnormal findings: Secondary | ICD-10-CM

## 2017-05-02 NOTE — Progress Notes (Signed)
52 y.o. [id number] MarriedCaucasianF here for annual exam.  Doing well.  Reports had pain last month for a few days.  Has had intermittent pain.  This pain was similar to pain she had in September.  Ultrasound showed hemorrhagic cyst in September.  Pt did not go and see anyone last month.  Denies vaginal bleeding.  Patient's last menstrual period was 12/29/2015 (exact date).          Sexually active: Yes.    The current method of family planning is status post hysterectomy.    Exercising: Yes.    walking Smoker:  no  Health Maintenance: Pap:  11/11/15 Neg  08/14/13 Neg. HR HPV:neg  History of abnormal Pap:  Yes, CIN 1 MMG:  12/23/16 BIRADS1:Neg  Colonoscopy:  n/a BMD:   n/a TDaP:  06/2014 Screening Labs: not today    reports that she has quit smoking. She has never used smokeless tobacco. She reports that she drinks about 2.4 oz of alcohol per week. She reports that she does not use drugs.  Past Medical History:  Diagnosis Date  . 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 Headache    Migraines  . History of kidney stones    10 years with uti's  . Kidney stones     Past Surgical History:  Procedure Laterality Date  . CESAREAN SECTION  x2  . CYSTOSCOPY N/A 01/13/2016   Procedure: CYSTOSCOPY;  Surgeon: Megan Salon, MD;  Location: [facility];  Service: Gynecology;  Laterality: N/A;  . LAPAROSCOPIC BILATERAL SALPINGECTOMY Bilateral 01/13/2016   Procedure: LAPAROSCOPIC BILATERAL SALPINGECTOMY;  Surgeon: Megan Salon, MD;  Location: [facility];  Service: Gynecology;  Laterality: Bilateral;  . LAPAROSCOPIC HYSTERECTOMY N/A 01/13/2016   Procedure: HYSTERECTOMY TOTAL LAPAROSCOPIC WITH PERITONEAL BIOPSY;  Surgeon: Megan Salon, MD;  Location: [facility];  Service: Gynecology;  Laterality: N/A;  . LAPAROSCOPIC LYSIS OF ADHESIONS  01/13/2016   Procedure: LAPAROSCOPIC LYSIS OF ADHESIONS;  Surgeon: Megan Salon, MD;  Location: [facility];  Service: Gynecology;;  . LITHOTRIPSY  7/16    Current Outpatient  Medications  Medication Sig Dispense Refill  . ibuprofen (ADVIL,MOTRIN) 800 MG tablet Take 1 tablet (800 mg total) by mouth every 8 (eight) hours as needed. 30 tablet 0  . Multiple Vitamins-Minerals (MULTIVITAMIN WITH MINERALS) tablet Take by mouth daily.    . niacin 500 MG tablet Take 500 mg by mouth at bedtime.     No current facility-administered medications for this visit.     Family History  Problem Relation Age of Onset  . Diabetes Father 15    Review of Systems  Genitourinary:       Pain with intercourse   All other systems reviewed and are negative.   Exam:   BP 112/70 (BP Location: Right Arm, Patient Position: Sitting, Cuff Size: Normal)   Pulse 88   Resp 16   Ht 5' 0.75" (1.543 m)   Wt 128 lb (58.1 kg)   LMP 12/29/2015 (Exact Date)   BMI 24.38 kg/m    Height: 5' 0.75" (154.3 cm)  Ht Readings from Last 3 Encounters:  05/02/17 5' 0.75" (1.543 m)  09/17/16 5\' 1"  (1.549 m)  07/15/16 5\' 1"  (1.549 m)    General appearance: alert, cooperative and appears stated age Head: Normocephalic, without obvious abnormality, atraumatic Neck: no adenopathy, supple, symmetrical, trachea midline and thyroid normal to inspection and palpation Lungs: clear to auscultation bilaterally Breasts: normal appearance, no masses or tenderness  Heart: regular rate and rhythm Abdomen: soft, non-tender; bowel sounds normal; no masses,  no organomegaly Extremities: extremities normal, atraumatic, no cyanosis or edema Skin: Skin color, texture, turgor normal. No rashes or lesions Lymph nodes: Cervical, supraclavicular, and axillary nodes normal. No abnormal inguinal nodes palpated Neurologic: Grossly normal   Pelvic: External genitalia:  no lesions              Urethra:  normal appearing urethra with no masses, tenderness or lesions              Bartholins and Skenes: normal                 Vagina: normal appearing vagina with normal color and discharge, no lesions              Cervix:  absent              Pap taken: No. Bimanual Exam:  Uterus:  uterus absent              Adnexa: no mass, fullness, tenderness    Pelvic floor:  Continued tenderness to palpation of left pelvic floor               Rectovaginal: Confirms               Anus:  normal sphincter tone, no lesions  Chaperone was present for exam.  A:  Well Woman with normal exam S/p TLH/bilateral salpingectomy 12/17 Continues LLQ pain and, at times, painful intercourse Pelvic floor pain--did a few visits with PT without improvement (off flexeril) H/O kidney stones  P:   Mammogram guidelines reviewed New colon cancer screening guidelines reviewed.  Desires referral for colonoscopy after birthday.  Reminder placed. pap smear not indicated Declines lab work today.  Will try to do this next year and use topical Emla cream prior due to severe dislike of having blood work obtained. Return annually or prn

## 2017-05-20 ENCOUNTER — Other Ambulatory Visit: Payer: Self-pay | Admitting: Obstetrics & Gynecology

## 2017-05-20 NOTE — Telephone Encounter (Signed)
Patient inquiring about an ointment to put on her arm to numb it before labwork on 05/24/17.

## 2017-05-20 NOTE — Telephone Encounter (Signed)
Spoke with patient, returning for labs on 4/16, requesting Rx for numbing ointment discussed on 3/25. Confirmed pharmacy on file. Advised will review with Dr. Sabra Heck and return call once reviewed.   Dr. Sabra Heck -ok to send Rx for EMLA cream. Apply topically 30 min prior to lab draw. Dispense 5g/0RF. Order pended.

## 2017-05-23 MED ORDER — LIDOCAINE-PRILOCAINE 2.5-2.5 % EX CREA: 1.0000 "application " | TOPICAL_CREAM | Freq: Once | CUTANEOUS | 0 refills | Status: AC

## 2017-05-23 NOTE — Telephone Encounter (Signed)
Spoke with patient, notified Rx for EMLA cream to pharmacy. Advised to apply 30 min prior to lab appt. Patient verbalizes understanding and is agreeable.

## 2017-05-24 ENCOUNTER — Other Ambulatory Visit: Payer: BLUE CROSS/BLUE SHIELD

## 2017-05-24 ENCOUNTER — Other Ambulatory Visit: Payer: Self-pay | Admitting: Obstetrics & Gynecology

## 2017-05-24 DIAGNOSIS — Z0000 Encounter for general adult medical examination without abnormal findings: Secondary | ICD-10-CM

## 2017-05-24 DIAGNOSIS — R7309 Other abnormal glucose: Secondary | ICD-10-CM | POA: Diagnosis not present

## 2017-05-25 LAB — LIPID PANEL
CHOL/HDL RATIO: 2.8 ratio (ref 0.0–4.4)
CHOLESTEROL TOTAL: 172 mg/dL (ref 100–199)
HDL: 61 mg/dL (ref >39)
LDL Calculated: 97 mg/dL (ref 0–99)
TRIGLYCERIDES: 69 mg/dL (ref 0–149)
VLDL Cholesterol Cal: 14 mg/dL (ref 5–40)

## 2017-05-25 LAB — COMPREHENSIVE METABOLIC PANEL
ALT: 11 IU/L (ref 0–32)
AST: 10 IU/L (ref 0–40)
Albumin/Globulin Ratio: 1.6 (ref 1.2–2.2)
Albumin: 4.1 g/dL (ref 3.5–5.5)
Alkaline Phosphatase: 79 IU/L (ref 39–117)
BUN/Creatinine Ratio: 14 (ref 9–23)
BUN: 13 mg/dL (ref 6–24)
Bilirubin Total: 0.4 mg/dL (ref 0.0–1.2)
CALCIUM: 9.1 mg/dL (ref 8.7–10.2)
CO2: 25 mmol/L (ref 20–29)
Chloride: 103 mmol/L (ref 96–106)
Creatinine, Ser: 0.91 mg/dL (ref 0.57–1.00)
GFR, EST AFRICAN AMERICAN: 86 mL/min/{1.73_m2} (ref >59)
GFR, EST NON AFRICAN AMERICAN: 74 mL/min/{1.73_m2} (ref >59)
GLOBULIN, TOTAL: 2.5 g/dL (ref 1.5–4.5)
Glucose: 103 mg/dL — ABNORMAL HIGH (ref 65–99)
Potassium: 4.3 mmol/L (ref 3.5–5.2)
SODIUM: 141 mmol/L (ref 134–144)
TOTAL PROTEIN: 6.6 g/dL (ref 6.0–8.5)

## 2017-05-25 LAB — CBC
HEMOGLOBIN: 13.1 g/dL (ref 11.1–15.9)
Hematocrit: 38.5 % (ref 34.0–46.6)
MCH: 30.8 pg (ref 26.6–33.0)
MCHC: 34 g/dL (ref 31.5–35.7)
MCV: 91 fL (ref 79–97)
Platelets: 313 10*3/uL (ref 150–379)
RBC: 4.25 x10E6/uL (ref 3.77–5.28)
RDW: 13.1 % (ref 12.3–15.4)
WBC: 6.5 10*3/uL (ref 3.4–10.8)

## 2017-05-25 LAB — TSH: TSH: 0.906 u[IU]/mL (ref 0.450–4.500)

## 2017-05-25 LAB — VITAMIN D 25 HYDROXY (VIT D DEFICIENCY, FRACTURES): VIT D 25 HYDROXY: 36.2 ng/mL (ref 30.0–100.0)

## 2017-06-07 LAB — HGB A1C W/O EAG: Hgb A1c MFr Bld: 5.1 % (ref 4.8–5.6)

## 2017-06-07 LAB — SPECIMEN STATUS REPORT

## 2017-10-14 ENCOUNTER — Telehealth: Payer: Self-pay | Admitting: Obstetrics & Gynecology

## 2017-10-14 NOTE — Telephone Encounter (Signed)
Patient has some questions regarding a "recent test she had done"?

## 2017-10-14 NOTE — Telephone Encounter (Signed)
Spoke with patient. Patient has questions regarding billing. Message to buisness office for return call. See account notes. Encounter closed.

## 2018-01-03 DIAGNOSIS — M2012 Hallux valgus (acquired), left foot: Secondary | ICD-10-CM | POA: Diagnosis not present

## 2018-02-27 ENCOUNTER — Telehealth: Payer: Self-pay | Admitting: *Deleted

## 2018-02-27 DIAGNOSIS — Z1211 Encounter for screening for malignant neoplasm of colon: Secondary | ICD-10-CM

## 2018-02-27 NOTE — Telephone Encounter (Signed)
Left message to call Othon Guardia, RN at [facility] [phone].   

## 2018-02-27 NOTE — Telephone Encounter (Signed)
Spoke with patient. No preference for GI referral, ok to proceed with referral to Dr. Collene Mares. Order placed. Advised patient referral coordinator will f/u with appt details once scheduled. Patient verbalizes understanding.   Routing to provider for final review. Patient is agreeable to disposition. Will close encounter.  Cc: Magdalene Patricia

## 2018-02-27 NOTE — Telephone Encounter (Signed)
Patient is returning call to Jill. °

## 2018-02-27 NOTE — Telephone Encounter (Signed)
-----   Message from Megan Salon, MD sent at 02/25/2018  7:14 AM EST ----- Regarding: referral for colonoscopy This pt advised me to refer her for a colonoscopy after her birthday which was [birth year].  Please call her and see if she has a preference for referral.  If not, please refer to Dr. Collene Mares for screening colonoscopy.  Thanks.

## 2018-02-28 ENCOUNTER — Other Ambulatory Visit: Payer: Self-pay

## 2018-02-28 ENCOUNTER — Encounter: Payer: Self-pay | Admitting: Obstetrics & Gynecology

## 2018-02-28 ENCOUNTER — Ambulatory Visit: Payer: BLUE CROSS/BLUE SHIELD | Admitting: Obstetrics & Gynecology

## 2018-02-28 ENCOUNTER — Telehealth: Payer: Self-pay | Admitting: Obstetrics & Gynecology

## 2018-02-28 VITALS — BP 112/60 | HR 84 | Resp 16 | Ht 60.75 in | Wt 128.6 lb

## 2018-02-28 DIAGNOSIS — N644 Mastodynia: Secondary | ICD-10-CM

## 2018-02-28 NOTE — Telephone Encounter (Signed)
Spoke with patient.   1. Patient is overdue for screening MMG, request location in [place]. Patient reports tenderness in right breast. Denies lumps, skin changes or nipple d/c. Denies recent injury. Hx of hysterectomy, is unsure if related to hormone changes. Recommended OV for further evaluation. OV scheduled for today at 4pm with Dr. Sabra Heck.   2. Patient request to cancel referral to Dr. Collene Mares for colonoscopy. Patient would like to go to provider in [place]. Will return call to office to provide name of provider for referral.   Patient verbalizes understanding.  Message to [facility] to cancel referral.   Routing to provider for final review. Patient is agreeable to disposition. Will close encounter.  Cc: Magdalene Patricia

## 2018-02-28 NOTE — Telephone Encounter (Signed)
Patient is asking to talk with Amy Curry again. No details given.

## 2018-02-28 NOTE — Progress Notes (Signed)
GYNECOLOGY  VISIT  CC:   Breast tenderness, pelvic pain and discharge.   HPI: 53 y.o. G23P2002 Married White or Caucasian female here for complaint of right breast pain that has been occurring for a few weeks.  She has not felt any masses or seen any skin changes.  Denies trauma or bruising.  Has no nipple discharge.  Has feelt like she could be PMS or cycle related.  Reports LLQ pain resolved for several months but very recently, this returned and at the same time, the breast pain returned.  She wonders if these are related to ovarian function.  Last MMG was 12/21/16.  Knows due.  Wants ot know what kind of MMG to schedule.    GYNECOLOGIC HISTORY: Patient's last menstrual period was 12/29/2015 (exact date). Contraception: hysterectomy  Menopausal hormone therapy: none  Patient Active Problem List   Diagnosis Date Noted  . Insomnia 09/20/2016  . Pelvic floor dysfunction 07/18/2016  . PMDD (premenstrual dysphoric disorder) 07/18/2016  . Nephrolithiasis 12/06/2014    Past Medical History:  Diagnosis Date  . 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 Headache    Migraines  . History of kidney stones    10 years with uti's  . Kidney stones     Past Surgical History:  Procedure Laterality Date  . CESAREAN SECTION  x2  . CYSTOSCOPY N/A 01/13/2016   Procedure: CYSTOSCOPY;  Surgeon: Megan Salon, MD;  Location: [facility];  Service: Gynecology;  Laterality: N/A;  . LAPAROSCOPIC BILATERAL SALPINGECTOMY Bilateral 01/13/2016   Procedure: LAPAROSCOPIC BILATERAL SALPINGECTOMY;  Surgeon: Megan Salon, MD;  Location: [facility];  Service: Gynecology;  Laterality: Bilateral;  . LAPAROSCOPIC HYSTERECTOMY N/A 01/13/2016   Procedure: HYSTERECTOMY TOTAL LAPAROSCOPIC WITH PERITONEAL BIOPSY;  Surgeon: Megan Salon, MD;  Location: [facility];  Service: Gynecology;  Laterality: N/A;  . LAPAROSCOPIC LYSIS OF ADHESIONS  01/13/2016   Procedure: LAPAROSCOPIC LYSIS OF ADHESIONS;  Surgeon: Megan Salon, MD;   Location: [facility];  Service: Gynecology;;  . LITHOTRIPSY  7/16    MEDS:   Current Outpatient Medications on File Prior to Visit  Medication Sig Dispense Refill  . DUEXIS 800-26.6 MG TABS     . ibuprofen (ADVIL,MOTRIN) 800 MG tablet Take 1 tablet (800 mg total) by mouth every 8 (eight) hours as needed. 30 tablet 0  . Multiple Vitamins-Minerals (MULTIVITAMIN WITH MINERALS) tablet Take by mouth daily.     No current facility-administered medications on file prior to visit.     ALLERGIES: Hydrocodone  Family History  Problem Relation Age of Onset  . Diabetes Father 22    SH:  Married, non smoker  Review of Systems  Genitourinary: Positive for vaginal discharge.       Breast pain   All other systems reviewed and are negative.   PHYSICAL EXAMINATION:    BP 112/60 (BP Location: Right Arm, Patient Position: Sitting, Cuff Size: Normal)   Pulse 84   Resp 16   Ht 5' 0.75" (1.543 m)   Wt 128 lb 9.6 oz (58.3 kg)   LMP 12/29/2015 (Exact Date)   BMI 24.50 kg/m     Physical Exam  Constitutional: She appears well-developed and well-nourished.  Neck: Normal range of motion. Neck supple. No thyromegaly present.  Cardiovascular: Normal rate and regular rhythm.  Respiratory: Effort normal and breath sounds normal. Right breast exhibits tenderness. Right breast exhibits no inverted nipple, no mass, no nipple discharge and no skin change. Left breast  exhibits no inverted nipple, no mass, no nipple discharge, no skin change and no tenderness. Breasts are symmetrical.    Lymphadenopathy:    She has no cervical adenopathy.    She has no axillary adenopathy.    Chaperone was present for exam.  Assessment: Left breast pain without discrete mass  Plan: Feel pt just need routine screening for her MMG at this time.  Will help her schedule this prior to leaving today.

## 2018-02-28 NOTE — Progress Notes (Signed)
Spoke with patient while in office. Call placed to [facility] main scheduling at [phone]. Spoke with Ruby. Patient scheduled for screening 3D MMG on 03/30/18 at 5:15pm, arrive at 5pm at [facility]. Patient verbalizes understanding and is agreeable.

## 2018-03-07 DIAGNOSIS — M2012 Hallux valgus (acquired), left foot: Secondary | ICD-10-CM | POA: Diagnosis not present

## 2018-03-15 ENCOUNTER — Telehealth: Payer: Self-pay | Admitting: Obstetrics & Gynecology

## 2018-03-15 NOTE — Telephone Encounter (Signed)
Call placed in reference to a referral for colonoscopy. Patient states she still has not found a place yet. She will call back once she is ready for scheduling.

## 2018-03-30 DIAGNOSIS — Z1231 Encounter for screening mammogram for malignant neoplasm of breast: Secondary | ICD-10-CM | POA: Diagnosis not present

## 2018-07-11 ENCOUNTER — Ambulatory Visit: Payer: BLUE CROSS/BLUE SHIELD | Admitting: Obstetrics & Gynecology

## 2018-07-11 ENCOUNTER — Encounter

## 2018-08-16 ENCOUNTER — Other Ambulatory Visit: Payer: Self-pay

## 2018-08-17 NOTE — Progress Notes (Signed)
53 y.o. G41P2002 Married White or Caucasian female here for annual exam.  Doing well.  Denies vaginal bleeding.  Had issues with foot bunion last winter due to pain.  Ended up on steroids but this really helped.  She may need this surgically treated at some point.    Had issues with breast pain earlier in the year.  Had normal MMG 03/30/2018.  Reports she feels this very cyclically and this is associated with her chronic/intermittet LLQ pain.  Feels like when she should cycle.    Blood work last year was good.  Patient's last menstrual period was 12/29/2015 (exact date).          Sexually active: Yes.    The current method of family planning is status post hysterectomy.    Exercising: Yes.    elliptical  Smoker:  Former smoker   Health Maintenance: Pap:  11/11/15 Neg             08/14/13 Neg. HR HPV:neg  History of abnormal Pap:  yes MMG:  03-30-2018 BIRADS 2 benign  Colonoscopy:  No.  Declines colonoscopy.  Cologuard discussed.   BMD:   no TDaP:  06-2014 Pneumonia vaccine(s):  no Shingrix:   Declines Hep C testing: no Screening Labs: done through work    reports that she has quit smoking. She has never used smokeless tobacco. She reports current alcohol use of about 4.0 standard drinks of alcohol per week. She reports that she does not use drugs.  Past Medical History:  Diagnosis Date  . 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 Headache    Migraines  . History of kidney stones    10 years with uti's  . Kidney stones     Past Surgical History:  Procedure Laterality Date  . CESAREAN SECTION  x2  . CYSTOSCOPY N/A 01/13/2016   Procedure: CYSTOSCOPY;  Surgeon: Megan Salon, MD;  Location: [facility];  Service: Gynecology;  Laterality: N/A;  . LAPAROSCOPIC BILATERAL SALPINGECTOMY Bilateral 01/13/2016   Procedure: LAPAROSCOPIC BILATERAL SALPINGECTOMY;  Surgeon: Megan Salon, MD;  Location: [facility];  Service: Gynecology;  Laterality: Bilateral;  . LAPAROSCOPIC HYSTERECTOMY N/A  01/13/2016   Procedure: HYSTERECTOMY TOTAL LAPAROSCOPIC WITH PERITONEAL BIOPSY;  Surgeon: Megan Salon, MD;  Location: [facility];  Service: Gynecology;  Laterality: N/A;  . LAPAROSCOPIC LYSIS OF ADHESIONS  01/13/2016   Procedure: LAPAROSCOPIC LYSIS OF ADHESIONS;  Surgeon: Megan Salon, MD;  Location: [facility];  Service: Gynecology;;  . LITHOTRIPSY  7/16    Current Outpatient Medications  Medication Sig Dispense Refill  . Multiple Vitamins-Minerals (MULTIVITAMIN WITH MINERALS) tablet Take by mouth daily.     No current facility-administered medications for this visit.     Family History  Problem Relation Age of Onset  . Diabetes Father 34    Review of Systems  All other systems reviewed and are negative.   Exam:   BP (!) 110/58 (BP Location: Left Arm, Patient Position: Sitting, Cuff Size: Normal)   Pulse 66   Temp 98.2 F (36.8 C) (Temporal)   Resp 14   Ht 5\' 1"  (1.549 m)   Wt 120 lb 4 oz (54.5 kg)   LMP 12/29/2015 (Exact Date)   BMI 22.72 kg/m     Height: 5\' 1"  (154.9 cm)  Ht Readings from Last 3 Encounters:  08/18/18 5\' 1"  (1.549 m)  02/28/18 5' 0.75" (1.543 m)  05/02/17 5' 0.75" (1.543 m)    General appearance:  alert, cooperative and appears stated age Head: Normocephalic, without obvious abnormality, atraumatic Neck: no adenopathy, supple, symmetrical, trachea midline and thyroid normal to inspection and palpation Lungs: clear to auscultation bilaterally Breasts: normal appearance, no masses or tenderness Heart: regular rate and rhythm Abdomen: soft, non-tender; bowel sounds normal; no masses,  no organomegaly Extremities: extremities normal, atraumatic, no cyanosis or edema Skin: Skin color, texture, turgor normal. No rashes or lesions Lymph nodes: Cervical, supraclavicular, and axillary nodes normal. No abnormal inguinal nodes palpated Neurologic: Grossly normal   Pelvic: External genitalia:  no lesions              Urethra:  normal appearing urethra with no  masses, tenderness or lesions              Bartholins and Skenes: normal                 Vagina: normal appearing vagina with normal color and discharge, no lesions              Cervix: absent              Pap taken: No. Bimanual Exam:  Uterus:  uterus absent              Adnexa: no mass, fullness, tenderness               Rectovaginal: Confirms               Anus:  normal sphincter tone, no lesions  Chaperone was present for exam.  A:  Well Woman with normal exam S/p TLH/bilateral salpingectomy/cystoscopy 12/17 Intermittent LLQ pain H/o renal stones  P:   Mammogram guidelines reviewed.  Doing MMG. pap smear not indicated Colonoscopy recommended.  She declines.  Cologuard will be orderd. Lab work done 4/19.  Not needed today Declines shingrix vaccination Return annually or prn

## 2018-08-18 ENCOUNTER — Ambulatory Visit: Payer: BC Managed Care – PPO | Admitting: Obstetrics & Gynecology

## 2018-08-18 ENCOUNTER — Encounter: Payer: Self-pay | Admitting: Obstetrics & Gynecology

## 2018-08-18 ENCOUNTER — Other Ambulatory Visit: Payer: Self-pay

## 2018-08-18 VITALS — BP 110/58 | HR 66 | Temp 98.2°F | Resp 14 | Ht 61.0 in | Wt 120.2 lb

## 2018-08-18 DIAGNOSIS — Z01419 Encounter for gynecological examination (general) (routine) without abnormal findings: Secondary | ICD-10-CM | POA: Diagnosis not present

## 2018-10-08 ENCOUNTER — Other Ambulatory Visit: Payer: Self-pay

## 2018-10-08 ENCOUNTER — Emergency Department
Admission: EM | Admit: 2018-10-08 | Discharge: 2018-10-08 | Disposition: A | Discharge status: Home / Self Care | Payer: BC Managed Care – PPO | Source: Home / Self Care | Attending: Family Medicine | Admitting: Family Medicine

## 2018-10-08 ENCOUNTER — Encounter: Payer: Self-pay | Admitting: Emergency Medicine

## 2018-10-08 DIAGNOSIS — M545 Low back pain, unspecified: Secondary | ICD-10-CM

## 2018-10-08 LAB — POCT URINALYSIS DIP (MANUAL ENTRY)
Bilirubin, UA: NEGATIVE
Blood, UA: NEGATIVE
Glucose, UA: NEGATIVE mg/dL
Ketones, POC UA: NEGATIVE mg/dL
Leukocytes, UA: NEGATIVE
Nitrite, UA: NEGATIVE
Protein Ur, POC: NEGATIVE mg/dL
Spec Grav, UA: >=1.03 — AB (ref 1.010–1.025)
Urobilinogen, UA: 0.2 E.U./dL
pH, UA: 5.5 (ref 5.0–8.0)

## 2018-10-08 NOTE — ED Triage Notes (Signed)
Patient reports noticing pink on tissue when wiping post urination for about a week; now has pain both sides lower back; had UTI 4-6 weeks ago diagnosed televisit and no culture/sensitivity done. She has not travelled past 4 weeks.

## 2018-10-08 NOTE — ED Provider Notes (Signed)
[facility]    CSN: [id number] Arrival date & time: 10/08/18  1429      History   Chief Complaint Chief Complaint  Patient presents with  . Hematuria  . Back Pain    HPI Amy Curry is a 53 y.o. female.   Patient complains of recurring lower back pain after her daily walk.  She denies recent injury but admits that she often walks at a fast pace.  Her back pain is worse with movement but does not radiate.  She is concerned that she may have a UTI because she has noticed small amount of pink on tissue when wiping post void.  However, she denies dysuria, frequency, urgency, nocturia.  She states that she was treated for a UTI about 4 to 6 weeks ago with classic symptoms of cystitis which resolved after antibiotic treatment. She denies fevers, chills, and sweats or abdominal pain.  The history is provided by the patient.  Back Pain Location:  Lumbar spine Quality:  Aching Radiates to:  Does not radiate Pain severity:  Mild Pain is:  Worse during the day Onset quality:  Sudden Duration:  1 week Timing:  Intermittent Progression:  Unchanged Chronicity:  New Context: twisting   Relieved by:  Being still Worsened by:  Ambulation, bending and movement Ineffective treatments:  None tried Associated symptoms: no abdominal pain, no abdominal swelling, no bladder incontinence, no bowel incontinence, no chest pain, no dysuria, no fever, no leg pain, no paresthesias, no pelvic pain, no perianal numbness, no tingling, no weakness and no weight loss     Past Medical History:  Diagnosis Date  . 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 Headache    Migraines  . History of kidney stones    10 years with uti's  . Kidney stones     Patient Active Problem List   Diagnosis Date Noted  . Insomnia 09/20/2016  . Pelvic floor dysfunction 07/18/2016  . PMDD (premenstrual dysphoric disorder) 07/18/2016  . Nephrolithiasis 12/06/2014    Past Surgical History:   Procedure Laterality Date  . CESAREAN SECTION  x2  . CYSTOSCOPY N/A 01/13/2016   Procedure: CYSTOSCOPY;  Surgeon: Megan Salon, MD;  Location: [facility];  Service: Gynecology;  Laterality: N/A;  . LAPAROSCOPIC BILATERAL SALPINGECTOMY Bilateral 01/13/2016   Procedure: LAPAROSCOPIC BILATERAL SALPINGECTOMY;  Surgeon: Megan Salon, MD;  Location: [facility];  Service: Gynecology;  Laterality: Bilateral;  . LAPAROSCOPIC HYSTERECTOMY N/A 01/13/2016   Procedure: HYSTERECTOMY TOTAL LAPAROSCOPIC WITH PERITONEAL BIOPSY;  Surgeon: Megan Salon, MD;  Location: [facility];  Service: Gynecology;  Laterality: N/A;  . LAPAROSCOPIC LYSIS OF ADHESIONS  01/13/2016   Procedure: LAPAROSCOPIC LYSIS OF ADHESIONS;  Surgeon: Megan Salon, MD;  Location: [facility];  Service: Gynecology;;  . LITHOTRIPSY  7/16    OB History    Gravida  2   Para  2   Term  2   Preterm      AB      Living  2     SAB      TAB      Ectopic      Multiple      Live Births  2            Home Medications    Prior to Admission medications   Medication Sig Start Date End Date Taking? Authorizing Provider  Multiple Vitamins-Minerals (MULTIVITAMIN WITH MINERALS) tablet Take by mouth daily.  [provider]    Family History Family History  Problem Relation Age of Onset  . Diabetes Father 83    Social History Social History   Tobacco Use  . Smoking status: Former Research scientist (life sciences)  . Smokeless tobacco: Never Used  Substance Use Topics  . Alcohol use: Yes    Alcohol/week: 4.0 standard drinks    Types: 4 Standard drinks or equivalent per week  . Drug use: No     Allergies   Hydrocodone   Review of Systems Review of Systems  Constitutional: Negative for fever and weight loss.  Cardiovascular: Negative for chest pain.  Gastrointestinal: Negative for abdominal pain and bowel incontinence.  Genitourinary: Negative for bladder incontinence, dysuria and pelvic pain.  Musculoskeletal: Positive for back pain.   Neurological: Negative for tingling, weakness and paresthesias.  All other systems reviewed and are negative.    Physical Exam Triage Vital Signs ED Triage Vitals  Enc Vitals Group     BP 10/08/18 1534 (!) 148/80     Pulse Rate 10/08/18 1534 81     Resp 10/08/18 1534 16     Temp 10/08/18 1534 98.6 F (37 C)     Temp Source 10/08/18 1534 Oral     SpO2 10/08/18 1534 100 %     Weight 10/08/18 1535 115 lb (52.2 kg)     Height 10/08/18 1535 5\' 1"  (1.549 m)     Head Circumference --      Peak Flow --      Pain Score 10/08/18 1535 2     Pain Loc --      Pain Edu? --      Excl. in [place]? --    No data found.  Updated Vital Signs BP (!) 148/80 (BP Location: Right Arm)   Pulse 81   Temp 98.6 F (37 C) (Oral)   Resp 16   Ht 5\' 1"  (1.549 m)   Wt 52.2 kg   LMP 12/29/2015 (Exact Date)   SpO2 100%   BMI 21.73 kg/m   Visual Acuity Right Eye Distance:   Left Eye Distance:   Bilateral Distance:    Right Eye Near:   Left Eye Near:    Bilateral Near:     Physical Exam Vitals signs and nursing note reviewed.  Constitutional:      General: She is not in acute distress. HENT:     Head: Normocephalic.  Eyes:     Pupils: Pupils are equal, round, and reactive to light.  Neck:     Musculoskeletal: Normal range of motion.  Cardiovascular:     Heart sounds: Normal heart sounds.  Pulmonary:     Breath sounds: Normal breath sounds.  Abdominal:     Tenderness: There is no abdominal tenderness. There is no right CVA tenderness or left CVA tenderness.  Musculoskeletal:     Lumbar back: She exhibits tenderness. She exhibits no bony tenderness.       Back:     Right lower leg: No edema.     Left lower leg: No edema.  Skin:    General: Skin is warm and dry.  Neurological:     Mental Status: She is alert.      UC Treatments / Results  Labs (all labs ordered are listed, but only abnormal results are displayed) Labs Reviewed  POCT URINALYSIS DIP (MANUAL ENTRY) - Abnormal;  Notable for the following components:      Result Value   Color, UA straw (*)  Spec Grav, UA >=1.030 (*)    All other components within normal limits  URINE CULTURE    EKG   Radiology No results found.  Procedures Procedures (including critical care time)  Medications Ordered in UC Medications - No data to display  Initial Impression / Assessment and Plan / UC Course  I have reviewed the triage vital signs and the nursing notes.  Pertinent labs & imaging results that were available during my care of the patient were reviewed by me and considered in my medical decision making (see chart for details).    Note normal urinalysis. No evidence UTI.  Will check urine culture. Appears to have musculoskeletal lower back pain. Followup with Dr. Aundria Mems or Dr. Lynne Leader ([facility]) if not improving about two weeks.    Final Clinical Impressions(s) / UC Diagnoses   Final diagnoses:  Acute bilateral low back pain without sciatica     Discharge Instructions     After a daily walk, apply an ice pack to lower back for 20 to 30 minutes,  Try Ibuprofen 200mg , 4 tabs every 8 hours with food.  Begin back range of motion and stretching exercises as tolerated.    ED Prescriptions    None        Kandra Nicolas, MD 10/08/18 2205

## 2018-10-08 NOTE — Discharge Instructions (Addendum)
After a daily walk, apply an ice pack to lower back for 20 to 30 minutes,  Try Ibuprofen 200mg , 4 tabs every 8 hours with food.  Begin back range of motion and stretching exercises as tolerated.

## 2018-10-10 LAB — URINE CULTURE
MICRO NUMBER:: 828559
Result:: NO GROWTH
SPECIMEN QUALITY:: ADEQUATE

## 2018-10-16 ENCOUNTER — Encounter: Payer: Self-pay | Admitting: Obstetrics & Gynecology

## 2018-10-16 DIAGNOSIS — Z1211 Encounter for screening for malignant neoplasm of colon: Secondary | ICD-10-CM | POA: Diagnosis not present

## 2018-10-16 DIAGNOSIS — Z1212 Encounter for screening for malignant neoplasm of rectum: Secondary | ICD-10-CM | POA: Diagnosis not present

## 2018-10-21 LAB — COLOGUARD

## 2018-10-24 ENCOUNTER — Telehealth: Payer: Self-pay | Admitting: *Deleted

## 2018-10-24 NOTE — Telephone Encounter (Signed)
Patient notified of Cologuard results. Report to scan.

## 2019-04-30 ENCOUNTER — Encounter: Payer: Self-pay | Admitting: Certified Nurse Midwife

## 2019-05-14 ENCOUNTER — Emergency Department
Admission: EM | Admit: 2019-05-14 | Discharge: 2019-05-14 | Disposition: A | Discharge status: Home / Self Care | Payer: BC Managed Care – PPO | Source: Home / Self Care | Attending: Family Medicine | Admitting: Family Medicine

## 2019-05-14 ENCOUNTER — Other Ambulatory Visit: Payer: Self-pay

## 2019-05-14 DIAGNOSIS — R0981 Nasal congestion: Secondary | ICD-10-CM

## 2019-05-14 DIAGNOSIS — J069 Acute upper respiratory infection, unspecified: Secondary | ICD-10-CM

## 2019-05-14 MED ORDER — ACETAMINOPHEN 325 MG PO TABS
650.0000 mg | ORAL_TABLET | Freq: Once | ORAL | Status: AC
  Administered 2019-05-14: 650 mg via ORAL

## 2019-05-14 MED ORDER — ACETAMINOPHEN 325 MG PO TABS: 650.0000 mg | ORAL_TABLET | Freq: Once | ORAL | Status: DC

## 2019-05-14 NOTE — ED Triage Notes (Signed)
Pt c/o chills, fever, bodyaches, chest congestion, runny nose and sore throat since Friday. No known covid exposure. Taking nyquil and tylenol multi sxs prn.

## 2019-05-14 NOTE — Discharge Instructions (Addendum)
Take plain guaifenesin (1200mg  extended release tabs such as Mucinex) twice daily, with plenty of water, for cough and congestion.  May add Pseudoephedrine (30mg , one or two every 4 to 6 hours) for sinus congestion.  Get adequate rest.   May use Afrin nasal spray (or generic oxymetazoline) each morning for about 5 days and then discontinue.  Also recommend using saline nasal spray several times daily and saline nasal irrigation (AYR is a common brand).  Use Flonase nasal spray each morning after using Afrin nasal spray and saline nasal irrigation. Try warm salt water gargles for sore throat.  Stop all antihistamines (Nyquil, etc) for now, and other non-prescription cough/cold preparations. May take Ibuprofen 200mg , 4 tabs every 8 hours with food for body aches, fever, etc. May take Delsym Cough Suppressant at bedtime for nighttime cough.  Follow-up with family doctor if not improving about10 days.   Isolate yourself until COVID-19 test result is available.   If your COVID19 test is positive, then you are infected with the novel coronavirus and could give the virus to others.  Please continue isolation at home for at least 10 days since the start of your symptoms.  Once you complete your 10 day quarantine, you may return to normal activities as long as you've not had a fever for over 24 hours (without taking fever reducing medicine) and your symptoms are improving. Please continue good preventive care measures, including:  frequent hand-washing, avoid touching your face, cover coughs/sneezes, stay out of crowds and keep a 6 foot distance from others.  Go to the nearest hospital emergency room if fever/cough/breathlessness are severe or illness seems like a threat to life.

## 2019-05-14 NOTE — ED Provider Notes (Signed)
[facility]    CSN: [id number] Arrival date & time: 05/14/19  0959      History   Chief Complaint Chief Complaint  Patient presents with  . Fever  . Nasal Congestion  . Generalized Body Aches    HPI Amy Curry is a 54 y.o. female.   Patient complains of four day history of typical cold-like symptoms developing over several days, including mild sore throat, sinus congestion, fatigue, and cough.  She has developed chills/sweats and low grade fever.   She denies chest tightness, shortness of breath, and changes in taste/smell.    The history is provided by the patient.    Past Medical History:  Diagnosis Date  . 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 Headache    Migraines  . History of kidney stones    10 years with uti's  . Kidney stones     Patient Active Problem List   Diagnosis Date Noted  . Insomnia 09/20/2016  . Pelvic floor dysfunction 07/18/2016  . PMDD (premenstrual dysphoric disorder) 07/18/2016  . Nephrolithiasis 12/06/2014    Past Surgical History:  Procedure Laterality Date  . CESAREAN SECTION  x2  . CYSTOSCOPY N/A 01/13/2016   Procedure: CYSTOSCOPY;  Surgeon: Megan Salon, MD;  Location: [facility];  Service: Gynecology;  Laterality: N/A;  . LAPAROSCOPIC BILATERAL SALPINGECTOMY Bilateral 01/13/2016   Procedure: LAPAROSCOPIC BILATERAL SALPINGECTOMY;  Surgeon: Megan Salon, MD;  Location: [facility];  Service: Gynecology;  Laterality: Bilateral;  . LAPAROSCOPIC HYSTERECTOMY N/A 01/13/2016   Procedure: HYSTERECTOMY TOTAL LAPAROSCOPIC WITH PERITONEAL BIOPSY;  Surgeon: Megan Salon, MD;  Location: [facility];  Service: Gynecology;  Laterality: N/A;  . LAPAROSCOPIC LYSIS OF ADHESIONS  01/13/2016   Procedure: LAPAROSCOPIC LYSIS OF ADHESIONS;  Surgeon: Megan Salon, MD;  Location: [facility];  Service: Gynecology;;  . LITHOTRIPSY  7/16    OB History    Gravida  2   Para  2   Term  2   Preterm      AB      Living  2     SAB      TAB        Ectopic      Multiple      Live Births  2            Home Medications    Prior to Admission medications   Medication Sig Start Date End Date Taking? Authorizing Provider  Multiple Vitamins-Minerals (MULTIVITAMIN WITH MINERALS) tablet Take by mouth daily.    [provider]    Family History Family History  Problem Relation Age of Onset  . Diabetes Father 25    Social History Social History   Tobacco Use  . Smoking status: Former Research scientist (life sciences)  . Smokeless tobacco: Never Used  Substance Use Topics  . Alcohol use: Yes    Alcohol/week: 4.0 standard drinks    Types: 4 Standard drinks or equivalent per week  . Drug use: No     Allergies   Hydrocodone   Review of Systems Review of Systems + sore throat + cough No pleuritic pain No wheezing + nasal congestion + post-nasal drainage No sinus pain/pressure No itchy/red eyes ? earache No hemoptysis No SOB + fever, + chills No nausea No vomiting No abdominal pain No diarrhea No urinary symptoms No skin rash + fatigue + myalgias No headache Used OTC meds (Nyquil) without relief   Physical Exam Triage Vital Signs  ED Triage Vitals  Enc Vitals Group     BP 05/14/19 1009 128/88     Pulse Rate 05/14/19 1009 (!) 112     Resp 05/14/19 1009 18     Temp 05/14/19 1009 99 F (37.2 C)     Temp Source 05/14/19 1009 Oral     SpO2 05/14/19 1009 98 %     Weight 05/14/19 1012 115 lb (52.2 kg)     Height 05/14/19 1012 5\' 1"  (1.549 m)     Head Circumference --      Peak Flow --      Pain Score 05/14/19 1011 0     Pain Loc --      Pain Edu? --      Excl. in [place]? --    No data found.  Updated Vital Signs BP 128/88 (BP Location: Right Arm)   Pulse (!) 112   Temp 99 F (37.2 C) (Oral)   Resp 18   Ht 5\' 1"  (1.549 m)   Wt 52.2 kg   LMP 12/29/2015 (Exact Date)   SpO2 98%   BMI 21.73 kg/m   Visual Acuity Right Eye Distance:   Left Eye Distance:   Bilateral Distance:    Right Eye Near:    Left Eye Near:    Bilateral Near:     Physical Exam Nursing notes and Vital Signs reviewed. Appearance:  Patient appears stated age, and in no acute distress Eyes:  Pupils are equal, round, and reactive to light and accomodation.  Extraocular movement is intact.  Conjunctivae are not inflamed  Ears:  Canals normal.  Tympanic membranes normal.  Nose:  Mildly congested turbinates.  No sinus tenderness.   Pharynx:  Normal Neck:  Supple.  Mildly enlarged lateral nodes are present, tender to palpation on the left.   Lungs:  Clear to auscultation.  Breath sounds are equal.  Moving air well. Heart:  Regular rate and rhythm without murmurs, rubs, or gallops.  Abdomen:  Nontender without masses or hepatosplenomegaly.  Bowel sounds are present.  No CVA or flank tenderness.  Extremities:  No edema.  Skin:  No rash present.   UC Treatments / Results  Labs (all labs ordered are listed, but only abnormal results are displayed) Labs Reviewed  NOVEL CORONAVIRUS, NAA    EKG   Radiology No results found.  Procedures Procedures (including critical care time)  Medications Ordered in UC Medications  acetaminophen (TYLENOL) tablet 650 mg (650 mg Oral Given 05/14/19 1017)    Initial Impression / Assessment and Plan / UC Course  I have reviewed the triage vital signs and the nursing notes.  Pertinent labs & imaging results that were available during my care of the patient were reviewed by me and considered in my medical decision making (see chart for details).    There is no evidence of bacterial infection today.  Treat symptomatically for now  COVID19 send out. Followup with Family Doctor if not improved in about 10 days.   Final Clinical Impressions(s) / UC Diagnoses   Final diagnoses:  Nasal congestion  Viral URI with cough     Discharge Instructions     Take plain guaifenesin (1200mg  extended release tabs such as Mucinex) twice daily, with plenty of water, for cough and  congestion.  May add Pseudoephedrine (30mg , one or two every 4 to 6 hours) for sinus congestion.  Get adequate rest.   May use Afrin nasal spray (or generic oxymetazoline) each morning for about 5 days  and then discontinue.  Also recommend using saline nasal spray several times daily and saline nasal irrigation (AYR is a common brand).  Use Flonase nasal spray each morning after using Afrin nasal spray and saline nasal irrigation. Try warm salt water gargles for sore throat.  Stop all antihistamines (Nyquil, etc) for now, and other non-prescription cough/cold preparations. May take Ibuprofen 200mg , 4 tabs every 8 hours with food for body aches, fever, etc. May take Delsym Cough Suppressant at bedtime for nighttime cough.  Follow-up with family doctor if not improving about10 days.   Isolate yourself until COVID-19 test result is available.   If your COVID19 test is positive, then you are infected with the novel coronavirus and could give the virus to others.  Please continue isolation at home for at least 10 days since the start of your symptoms.  Once you complete your 10 day quarantine, you may return to normal activities as long as you've not had a fever for over 24 hours (without taking fever reducing medicine) and your symptoms are improving. Please continue good preventive care measures, including:  frequent hand-washing, avoid touching your face, cover coughs/sneezes, stay out of crowds and keep a 6 foot distance from others.  Go to the nearest hospital emergency room if fever/cough/breathlessness are severe or illness seems like a threat to life.     ED Prescriptions    None        Kandra Nicolas, MD 05/14/19 1112

## 2019-05-16 ENCOUNTER — Telehealth (HOSPITAL_COMMUNITY): Payer: Self-pay | Admitting: *Deleted

## 2019-05-16 ENCOUNTER — Telehealth: Payer: Self-pay | Admitting: *Deleted

## 2019-05-16 LAB — NOVEL CORONAVIRUS, NAA: SARS-CoV-2, NAA: DETECTED — AB

## 2019-05-16 LAB — SARS-COV-2, NAA 2 DAY TAT

## 2019-05-16 NOTE — Telephone Encounter (Signed)
Your test for COVID-19 was positive ("detected"), meaning that you were infected with the novel coronavirus and could give the germ to others.    Please continue isolation at home, for at least 10 days since the start of your fever/cough/breathlessness and until you have had 24 hours without fever (without taking a fever reducer) and with any cough/breathlessness improving. Use over-the-counter medications for symptoms.  If you have had no symptoms, but were exposed to someone who was positive for COVID-19, you will need to quarantine and self-isolate for 14 days from the date of exposure.    Please continue good preventive care measures, including:  frequent hand-washing, avoid touching your face, cover coughs/sneezes, stay out of crowds and keep a 6 foot distance from others.  Clean hard surfaces touched frequently with disinfectant cleaning products.   Please check in with your primary care provider about your positive test result.  Go to the nearest urgent care or ED for assessment if you have severe breathlessness or severe weakness/fatigue (ex needing new help getting out of bed or to the bathroom).  Members of your household will also need to quarantine for 14 days from the date of your positive test. You may be contacted to discuss possible treatment options, and you may also be contacted by the health department for follow up. Please call [facility] at [phone] if you have any questions or concerns.  Thank you, Latrelle Dodrill, RN

## 2019-05-16 NOTE — Telephone Encounter (Signed)
Patient viewed results on mychart. Called inquiring about the "reference range" meaning. Explained labs and reiterated the 10 day quarantine. Once 10 days is complete if fever free without use of fever reducing medications she may resume activities. Patient verbalized understanding. Advised her the centralized results RN will be calling her as well to discuss.

## 2019-06-18 DIAGNOSIS — Z23 Encounter for immunization: Secondary | ICD-10-CM | POA: Diagnosis not present

## 2019-10-12 NOTE — Progress Notes (Signed)
54 y.o. G36P2002 Married White or Caucasian female here for annual exam.  She is working on weight loss.  Working out at home.  Still having some issues with her feet and bunions.  She's working from home.  Not having to wear heels has helped.  Not sure how long she will be working from home.    Denies vaginal bleeding.    Has wellness blood work done at work.  Glucose, cholesterol was done with this and it was normal.    Occasionally still has LLQ but less frequent.  Not really sure what made the difference.    Patient's last menstrual period was 12/29/2015 (exact date).          Sexually active: Yes.    The current method of family planning is status post hysterectomy.    Exercising: Yes.    treadmill Smoker:  no  Health Maintenance: Pap:  08-14-13 neg HPV HR neg,11-11-15 neg History of abnormal Pap:  yes MMG:  03-30-2018 birads 2:neg.  She is aware this is overdue.   Colonoscopy:  10/16/18 Cologuard - negative BMD:   none TDaP:  2016 Pneumonia vaccine(s):  n/a Shingrix:   Declines for now Hep C testing: n/a  Screening Labs: done in 2019.  Declines have this done today.   reports that she has quit smoking. She has never used smokeless tobacco. She reports current alcohol use of about 4.0 standard drinks of alcohol per week. She reports that she does not use drugs.  Past Medical History:  Diagnosis Date   Abnormal Pap smear 2005   CIN 1 per records   Headache    Migraines   History of kidney stones    10 years with uti's   Kidney stones     Past Surgical History:  Procedure Laterality Date   CESAREAN SECTION  x2   CYSTOSCOPY N/A 01/13/2016   Procedure: CYSTOSCOPY;  Surgeon: Megan Salon, MD;  Location: [facility];  Service: Gynecology;  Laterality: N/A;   LAPAROSCOPIC BILATERAL SALPINGECTOMY Bilateral 01/13/2016   Procedure: LAPAROSCOPIC BILATERAL SALPINGECTOMY;  Surgeon: Megan Salon, MD;  Location: [facility];  Service: Gynecology;  Laterality: Bilateral;   LAPAROSCOPIC  HYSTERECTOMY N/A 01/13/2016   Procedure: HYSTERECTOMY TOTAL LAPAROSCOPIC WITH PERITONEAL BIOPSY;  Surgeon: Megan Salon, MD;  Location: [facility];  Service: Gynecology;  Laterality: N/A;   LAPAROSCOPIC LYSIS OF ADHESIONS  01/13/2016   Procedure: LAPAROSCOPIC LYSIS OF ADHESIONS;  Surgeon: Megan Salon, MD;  Location: [facility];  Service: Gynecology;;   LITHOTRIPSY  7/16    Current Outpatient Medications  Medication Sig Dispense Refill   Multiple Vitamins-Minerals (MULTIVITAMIN WITH MINERALS) tablet Take by mouth daily.     No current facility-administered medications for this visit.    Family History  Problem Relation Age of Onset   Diabetes Father 55    Review of Systems  All other systems reviewed and are negative.   Exam:   BP 122/70 (BP Location: Left Arm, Patient Position: Sitting, Cuff Size: Normal)    Pulse 72    Resp 14    Ht 5\' 1"  (1.549 m)    Wt 120 lb (54.4 kg)    LMP 12/29/2015 (Exact Date)    BMI 22.67 kg/m   Height: 5\' 1"  (154.9 cm)  General appearance: alert, cooperative and appears stated age Head: Normocephalic, without obvious abnormality, atraumatic Neck: no adenopathy, supple, symmetrical, trachea midline and thyroid normal to inspection and palpation Lungs: clear to auscultation bilaterally Breasts: normal appearance,  no masses or tenderness Heart: regular rate and rhythm Abdomen: soft, non-tender; bowel sounds normal; no masses,  no organomegaly Extremities: extremities normal, atraumatic, no cyanosis or edema Skin: Skin color, texture, turgor normal. No rashes or lesions Lymph nodes: Cervical, supraclavicular, and axillary nodes normal. No abnormal inguinal nodes palpated Neurologic: Grossly normal   Pelvic: External genitalia:  no lesions              Urethra:  normal appearing urethra with no masses, tenderness or lesions              Bartholins and Skenes: normal                 Vagina: normal appearing vagina with normal color and discharge, no  lesions              Cervix: absent              Pap taken: No. Bimanual Exam:  Uterus:  uterus absent              Adnexa: no mass, fullness, tenderness               Rectovaginal: Confirms               Anus:  normal sphincter tone, no lesions  Chaperone, Terence Lux, CMA, was present for exam.  A:  Well Woman with normal exam S/p TLH/bilateral salpingectomy/cystoscopy 12/17 H/o renal stones  P:   Mammogram guidelines reviewed.  She is aware this is due. pap smear not indicated Cologuard neg 2020 Declines lab work today.  Will plan next year.  She may want EMLA prescription called in prior to appt for blood work Vaccines reviewed.  Declines shingrix.   Return annually or prn

## 2019-10-16 ENCOUNTER — Encounter: Payer: Self-pay | Admitting: Obstetrics & Gynecology

## 2019-10-16 ENCOUNTER — Other Ambulatory Visit: Payer: Self-pay

## 2019-10-16 ENCOUNTER — Ambulatory Visit: Payer: BC Managed Care – PPO | Admitting: Obstetrics & Gynecology

## 2019-10-16 VITALS — BP 122/70 | HR 72 | Resp 14 | Ht 61.0 in | Wt 120.0 lb

## 2019-10-16 DIAGNOSIS — Z01419 Encounter for gynecological examination (general) (routine) without abnormal findings: Secondary | ICD-10-CM

## 2020-01-21 ENCOUNTER — Other Ambulatory Visit (HOSPITAL_BASED_OUTPATIENT_CLINIC_OR_DEPARTMENT_OTHER): Payer: Self-pay | Admitting: Obstetrics & Gynecology

## 2020-01-21 ENCOUNTER — Other Ambulatory Visit: Payer: Self-pay | Admitting: Obstetrics & Gynecology

## 2020-01-21 DIAGNOSIS — Z1231 Encounter for screening mammogram for malignant neoplasm of breast: Secondary | ICD-10-CM

## 2020-01-24 ENCOUNTER — Ambulatory Visit (INDEPENDENT_AMBULATORY_CARE_PROVIDER_SITE_OTHER): Payer: BC Managed Care – PPO

## 2020-01-24 ENCOUNTER — Other Ambulatory Visit: Payer: Self-pay

## 2020-01-24 DIAGNOSIS — Z1231 Encounter for screening mammogram for malignant neoplasm of breast: Secondary | ICD-10-CM | POA: Diagnosis not present

## 2020-01-31 ENCOUNTER — Other Ambulatory Visit: Payer: Self-pay | Admitting: Obstetrics & Gynecology

## 2020-01-31 DIAGNOSIS — N6489 Other specified disorders of breast: Secondary | ICD-10-CM

## 2020-02-19 ENCOUNTER — Ambulatory Visit: Payer: BC Managed Care – PPO

## 2020-02-19 ENCOUNTER — Ambulatory Visit
Admission: RE | Admit: 2020-02-19 | Discharge: 2020-02-19 | Disposition: A | Discharge status: Home / Self Care | Payer: BC Managed Care – PPO | Source: Ambulatory Visit | Attending: Obstetrics & Gynecology | Admitting: Obstetrics & Gynecology

## 2020-02-19 ENCOUNTER — Other Ambulatory Visit: Payer: Self-pay

## 2020-02-19 DIAGNOSIS — R928 Other abnormal and inconclusive findings on diagnostic imaging of breast: Secondary | ICD-10-CM | POA: Diagnosis not present

## 2020-02-19 DIAGNOSIS — N6489 Other specified disorders of breast: Secondary | ICD-10-CM

## 2020-04-29 DIAGNOSIS — R1032 Left lower quadrant pain: Secondary | ICD-10-CM | POA: Diagnosis not present

## 2020-04-29 DIAGNOSIS — Z79899 Other long term (current) drug therapy: Secondary | ICD-10-CM | POA: Diagnosis not present

## 2020-04-29 DIAGNOSIS — N132 Hydronephrosis with renal and ureteral calculous obstruction: Secondary | ICD-10-CM | POA: Diagnosis not present

## 2020-04-29 DIAGNOSIS — N200 Calculus of kidney: Secondary | ICD-10-CM | POA: Diagnosis not present

## 2020-04-29 DIAGNOSIS — Z888 Allergy status to other drugs, medicaments and biological substances status: Secondary | ICD-10-CM | POA: Diagnosis not present

## 2020-04-29 DIAGNOSIS — Z87891 Personal history of nicotine dependence: Secondary | ICD-10-CM | POA: Diagnosis not present

## 2020-04-29 DIAGNOSIS — K3189 Other diseases of stomach and duodenum: Secondary | ICD-10-CM | POA: Diagnosis not present

## 2020-04-29 DIAGNOSIS — R109 Unspecified abdominal pain: Secondary | ICD-10-CM | POA: Diagnosis not present

## 2020-05-06 DIAGNOSIS — R933 Abnormal findings on diagnostic imaging of other parts of digestive tract: Secondary | ICD-10-CM | POA: Diagnosis not present

## 2020-05-09 ENCOUNTER — Emergency Department
Admission: EM | Admit: 2020-05-09 | Discharge: 2020-05-09 | Disposition: A | Discharge status: Home / Self Care | Payer: BC Managed Care – PPO | Source: Home / Self Care

## 2020-05-09 ENCOUNTER — Other Ambulatory Visit: Payer: Self-pay

## 2020-05-09 DIAGNOSIS — R1030 Lower abdominal pain, unspecified: Secondary | ICD-10-CM | POA: Diagnosis not present

## 2020-05-09 DIAGNOSIS — N3289 Other specified disorders of bladder: Secondary | ICD-10-CM | POA: Diagnosis not present

## 2020-05-09 DIAGNOSIS — R300 Dysuria: Secondary | ICD-10-CM | POA: Diagnosis not present

## 2020-05-09 LAB — POCT URINALYSIS DIP (MANUAL ENTRY)
Bilirubin, UA: NEGATIVE
Glucose, UA: NEGATIVE mg/dL
Ketones, POC UA: NEGATIVE mg/dL
Nitrite, UA: NEGATIVE
Protein Ur, POC: NEGATIVE mg/dL
Spec Grav, UA: 1.02 (ref 1.010–1.025)
Urobilinogen, UA: 0.2 E.U./dL
pH, UA: 7 (ref 5.0–8.0)

## 2020-05-09 MED ORDER — CYCLOBENZAPRINE HCL 10 MG PO TABS: 10.0000 mg | ORAL_TABLET | Freq: Two times a day (BID) | ORAL | 0 refills | Status: DC | PRN

## 2020-05-09 MED ORDER — UROGESIC-BLUE 81.6 MG PO TABS: 81.6000 mg | ORAL_TABLET | Freq: Four times a day (QID) | ORAL | 0 refills | Status: DC | PRN

## 2020-05-09 MED ORDER — KETOROLAC TROMETHAMINE 10 MG PO TABS: 10.0000 mg | ORAL_TABLET | Freq: Four times a day (QID) | ORAL | 0 refills | Status: DC | PRN

## 2020-05-09 NOTE — ED Triage Notes (Signed)
Pt c/o urinary frequency and dysuria (lower abd pain) x 1 week. Was seen in ED last Tues for kidney stone. Last dose of flomax was two days ago, was making her nauseous. Motrin prn. Pain 6/10

## 2020-05-09 NOTE — Discharge Instructions (Signed)
I have sent in toradol for you to take one tablet every 6 hours as needed for pain. Do not take ibuprofen with this medication. You may take tylenol with this medication.  I have sent in flexeril for you to take twice a day as needed for muscle spasms. This medication can make you sleepy. Do not drive or operate heavy machinery with this medication.  I have sent in urogesic blue for you to take one tablet every 6 hours as needed for painful urination. Drink plenty of water with this medication.  I have attached information for urology as well  Follow up with this office or with primary care if symptoms are persisting.  Follow up in the ER for high fever, trouble swallowing, trouble breathing, other concerning symptoms.

## 2020-05-09 NOTE — ED Provider Notes (Signed)
[facility]   CC: UTI  SUBJECTIVE:  Amy Curry is a 55 y.o. female who complains of urinary frequency, urgency and dysuria for the past week. Reports that she was seen in the ER for a kidney stone about 12 days ago.  Patient denies a precipitating event, recent sexual encounter, excessive caffeine intake. Localizes the pain to the lower abdomen. Pain is intermittent and describes it as burning. Has taken AZO and is at the limit of what she can safely take without relief. Symptoms are made worse with urination. Admits to similar symptoms in the past.  Denies fever, chills, nausea, vomiting, flank pain, abnormal vaginal discharge or bleeding, hematuria.  LMP: Patient's last menstrual period was 12/29/2015 (exact date).  ROS: As in HPI.  All other pertinent ROS negative.     Past Medical History:  Diagnosis Date  . 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 Headache    Migraines  . History of kidney stones    10 years with uti's  . Kidney stones    Past Surgical History:  Procedure Laterality Date  . CESAREAN SECTION  x2  . CYSTOSCOPY N/A 01/13/2016   Procedure: CYSTOSCOPY;  Surgeon: Megan Salon, MD;  Location: [facility];  Service: Gynecology;  Laterality: N/A;  . LAPAROSCOPIC BILATERAL SALPINGECTOMY Bilateral 01/13/2016   Procedure: LAPAROSCOPIC BILATERAL SALPINGECTOMY;  Surgeon: Megan Salon, MD;  Location: [facility];  Service: Gynecology;  Laterality: Bilateral;  . LAPAROSCOPIC HYSTERECTOMY N/A 01/13/2016   Procedure: HYSTERECTOMY TOTAL LAPAROSCOPIC WITH PERITONEAL BIOPSY;  Surgeon: Megan Salon, MD;  Location: [facility];  Service: Gynecology;  Laterality: N/A;  . LAPAROSCOPIC LYSIS OF ADHESIONS  01/13/2016   Procedure: LAPAROSCOPIC LYSIS OF ADHESIONS;  Surgeon: Megan Salon, MD;  Location: [facility];  Service: Gynecology;;  . LITHOTRIPSY  7/16   Allergies  Allergen Reactions  . Hydrocodone Itching   No current facility-administered medications on file prior to  encounter.   Current Outpatient Medications on File Prior to Encounter  Medication Sig Dispense Refill  . Multiple Vitamins-Minerals (MULTIVITAMIN WITH MINERALS) tablet Take by mouth daily.     Social History   Socioeconomic History  . Marital status: Married    Spouse name: Not on file  . Number of children: Not on file  . Years of education: Not on file  . Highest education level: Not on file  Occupational History  . Not on file  Tobacco Use  . Smoking status: Former Research scientist (life sciences)  . Smokeless tobacco: Never Used  Vaping Use  . Vaping Use: Never used  Substance and Sexual Activity  . Alcohol use: Yes    Alcohol/week: 4.0 standard drinks    Types: 4 Standard drinks or equivalent per week  . Drug use: No  . Sexual activity: Yes    Partners: Male    Birth control/protection: Surgical  Other Topics Concern  . Not on file  Social History Narrative  . Not on file   Social Determinants of Health   Financial Resource Strain: Not on file  Food Insecurity: Not on file  Transportation Needs: Not on file  Physical Activity: Not on file  Stress: Not on file  Social Connections: Not on file  Intimate Partner Violence: Not on file   Family History  Problem Relation Age of Onset  . Diabetes Father 26    OBJECTIVE:  Vitals:   05/09/20 1508  BP: (!) 151/79  Pulse: 85  Resp: 18  Temp: 98.2  F (36.8 C)  TempSrc: Oral  SpO2: 100%   General appearance: AOx3 in no acute distress HEENT: NCAT. Oropharynx clear.  Lungs: clear to auscultation bilaterally without adventitious breath sounds Heart: regular rate and rhythm. Radial pulses 2+ symmetrical bilaterally Abdomen: soft; non-distended; suprapubic tenderness; bowel sounds present; no guarding or rebound tenderness Back: no CVA tenderness Extremities: no edema; symmetrical with no gross deformities Skin: warm and dry Neurologic: Ambulates from chair to exam table without difficulty Psychological: alert and cooperative; normal  mood and affect  Labs Reviewed  POCT URINALYSIS DIP (MANUAL ENTRY) - Abnormal; Notable for the following components:      Result Value   Blood, UA trace-intact (*)    Leukocytes, UA Trace (*)    All other components within normal limits  URINE CULTURE    ASSESSMENT & PLAN:  1. Dysuria   2. Bladder spasms   3. Lower abdominal pain     Meds ordered this encounter  Medications  . ketorolac (TORADOL) 10 MG tablet    Sig: Take 1 tablet (10 mg total) by mouth every 6 (six) hours as needed.    Dispense:  20 tablet    Refill:  0    Order Specific Question:   Supervising Provider    Answer:   Chase Picket [id number]  . cyclobenzaprine (FLEXERIL) 10 MG tablet    Sig: Take 1 tablet (10 mg total) by mouth 2 (two) times daily as needed for muscle spasms.    Dispense:  20 tablet    Refill:  0    Order Specific Question:   Supervising Provider    Answer:   Chase Picket [id number]  . Methen-Hyosc-Meth Blue-Na Phos (UROGESIC-BLUE) 81.6 MG TABS    Sig: Take 1 tablet (81.6 mg total) by mouth every 6 (six) hours as needed.    Dispense:  30 tablet    Refill:  0    Order Specific Question:   Supervising Provider    Answer:   Chase Picket [id number]    Prescribed toradol one tablet every 6 hours as needed for pain Prescribed flexeril, one tablet twice a day as needed for bladder spasms Prescribed urogesic blue for urinary discomfort Renal calculi vs acute cystitis Urine culture sent  We will call you with abnormal results that need further treatment Push fluids and get plenty of rest  Follow up with PCP if symptoms persists Return here or go to ER if you have any new or worsening symptoms such as fever, worsening abdominal pain, nausea/vomiting, flank pain  Outlined signs and symptoms indicating need for more acute intervention Patient verbalized understanding After Visit Summary given     Faustino Congress, NP 05/09/20 1612

## 2020-05-11 LAB — URINE CULTURE
MICRO NUMBER:: 11723483
SPECIMEN QUALITY:: ADEQUATE

## 2020-06-20 DIAGNOSIS — K319 Disease of stomach and duodenum, unspecified: Secondary | ICD-10-CM | POA: Diagnosis not present

## 2020-06-20 DIAGNOSIS — K3189 Other diseases of stomach and duodenum: Secondary | ICD-10-CM | POA: Diagnosis not present

## 2020-06-20 DIAGNOSIS — R933 Abnormal findings on diagnostic imaging of other parts of digestive tract: Secondary | ICD-10-CM | POA: Diagnosis not present

## 2020-10-21 ENCOUNTER — Emergency Department
Admission: EM | Admit: 2020-10-21 | Discharge: 2020-10-21 | Disposition: A | Discharge status: Home / Self Care | Payer: BC Managed Care – PPO | Source: Home / Self Care | Attending: Family Medicine | Admitting: Family Medicine

## 2020-10-21 ENCOUNTER — Other Ambulatory Visit: Payer: Self-pay

## 2020-10-21 DIAGNOSIS — J40 Bronchitis, not specified as acute or chronic: Secondary | ICD-10-CM | POA: Diagnosis not present

## 2020-10-21 DIAGNOSIS — R0789 Other chest pain: Secondary | ICD-10-CM

## 2020-10-21 DIAGNOSIS — J069 Acute upper respiratory infection, unspecified: Secondary | ICD-10-CM

## 2020-10-21 MED ORDER — AZITHROMYCIN 250 MG PO TABS: ORAL_TABLET | ORAL | 0 refills | Status: DC

## 2020-10-21 MED ORDER — BENZONATATE 200 MG PO CAPS: 200.0000 mg | ORAL_CAPSULE | Freq: Two times a day (BID) | ORAL | 0 refills | Status: DC | PRN

## 2020-10-21 MED ORDER — PREDNISONE 20 MG PO TABS: 20.0000 mg | ORAL_TABLET | Freq: Two times a day (BID) | ORAL | 0 refills | Status: DC

## 2020-10-21 NOTE — ED Provider Notes (Signed)
[facility]    CSN: [id number] Arrival date & time: 10/21/20  [id number]      History   Chief Complaint Chief Complaint  Patient presents with   Sore Throat   Cough   Nasal Congestion    HPI Amy Curry is a 55 y.o. female.   HPI Patient states that she has a sore throat, pulm cough, nasal congestion.  She has felt like she has a fever.  She feels tired and body aches.  She has had COVID vaccinations.  She has not done a COVID test.  She states she is otherwise healthy.  He states that she started to have some chest pain in her anterior chest that "burns" with deep breath and cough. She patient is a non-smoker   Patient Active Problem List   Diagnosis Date Noted   Insomnia 09/20/2016   Pelvic floor dysfunction 07/18/2016   PMDD (premenstrual dysphoric disorder) 07/18/2016   Nephrolithiasis 12/06/2014    Past Surgical History:  Procedure Laterality Date   CESAREAN SECTION  x2   CYSTOSCOPY N/A 01/13/2016   Procedure: CYSTOSCOPY;  Surgeon: Megan Salon, MD;  Location: [facility];  Service: Gynecology;  Laterality: N/A;   LAPAROSCOPIC BILATERAL SALPINGECTOMY Bilateral 01/13/2016   Procedure: LAPAROSCOPIC BILATERAL SALPINGECTOMY;  Surgeon: Megan Salon, MD;  Location: [facility];  Service: Gynecology;  Laterality: Bilateral;   LAPAROSCOPIC HYSTERECTOMY N/A 01/13/2016   Procedure: HYSTERECTOMY TOTAL LAPAROSCOPIC WITH PERITONEAL BIOPSY;  Surgeon: Megan Salon, MD;  Location: [facility];  Service: Gynecology;  Laterality: N/A;   LAPAROSCOPIC LYSIS OF ADHESIONS  01/13/2016   Procedure: LAPAROSCOPIC LYSIS OF ADHESIONS;  Surgeon: Megan Salon, MD;  Location: [facility];  Service: Gynecology;;   LITHOTRIPSY  7/16    OB History     Gravida  2   Para  2   Term  2   Preterm      AB      Living  2      SAB      IAB      Ectopic      Multiple      Live Births  2            Home Medications    Prior to Admission medications   Medication Sig Start Date End Date  Taking? Authorizing Provider  azithromycin (ZITHROMAX Z-PAK) 250 MG tablet Take two pills today followed by one a day until gone 10/21/20  Yes Raylene Everts, MD  benzonatate (TESSALON) 200 MG capsule Take 1 capsule (200 mg total) by mouth 2 (two) times daily as needed for cough. 10/21/20  Yes Raylene Everts, MD  dextromethorphan-guaiFENesin [facility] DM) 30-600 MG 12hr tablet Take 1 tablet by mouth 2 (two) times daily.   Yes [provider]  DM-Doxylamine-Acetaminophen (NYQUIL COLD & FLU PO) Take by mouth.   Yes [provider]  predniSONE (DELTASONE) 20 MG tablet Take 1 tablet (20 mg total) by mouth 2 (two) times daily with a meal. 10/21/20  Yes Raylene Everts, MD    Family History Family History  Problem Relation Age of Onset   Kidney disease Mother    Diabetes Father 13   Heart disease Father    Diverticulosis Father     Social History Social History   Tobacco Use   Smoking status: Former    Types: Cigarettes   Smokeless tobacco: Never  Vaping Use   Vaping Use: Never used  Substance Use Topics  Alcohol use: Yes    Alcohol/week: 4.0 standard drinks    Types: 4 Standard drinks or equivalent per week   Drug use: No     Allergies   Hydrocodone   Review of Systems Review of Systems See HPI  Physical Exam Triage Vital Signs ED Triage Vitals  Enc Vitals Group     BP 10/21/20 0847 134/80     Pulse Rate 10/21/20 0847 84     Resp 10/21/20 0847 20     Temp 10/21/20 0847 98.5 F (36.9 C)     Temp Source 10/21/20 0847 Oral     SpO2 10/21/20 0847 100 %     Weight 10/21/20 0841 118 lb (53.5 kg)     Height 10/21/20 0841 '5\' 1"'$  (1.549 m)     Head Circumference --      Peak Flow --      Pain Score 10/21/20 0840 8     Pain Loc --      Pain Edu? --      Excl. in [place]? --    No data found.  Updated Vital Signs BP 134/80   Pulse 84   Temp 98.5 F (36.9 C) (Oral)   Resp 20   Ht '5\' 1"'$  (1.549 m)   Wt 53.5 kg   LMP 12/29/2015 (Exact Date)    SpO2 100%   BMI 22.30 kg/m   V    Physical Exam Constitutional:      General: She is not in acute distress.    Appearance: She is well-developed and normal weight. She is ill-appearing.     Comments: Appears tired  HENT:     Head: Normocephalic and atraumatic.     Right Ear: Tympanic membrane, ear canal and external ear normal.     Left Ear: Tympanic membrane, ear canal and external ear normal.     Nose: Congestion and rhinorrhea present.     Comments: Clear rhinorrhea    Mouth/Throat:     Mouth: Mucous membranes are moist.     Pharynx: Posterior oropharyngeal erythema present.  Eyes:     Conjunctiva/sclera: Conjunctivae normal.     Pupils: Pupils are equal, round, and reactive to light.  Cardiovascular:     Rate and Rhythm: Normal rate and regular rhythm.     Heart sounds: Normal heart sounds.  Pulmonary:     Effort: Pulmonary effort is normal. No respiratory distress.     Breath sounds: Rhonchi present. No wheezing or rales.  Abdominal:     General: There is no distension.     Palpations: Abdomen is soft.  Musculoskeletal:        General: Normal range of motion.     Cervical back: Normal range of motion and neck supple.  Lymphadenopathy:     Cervical: No cervical adenopathy.  Skin:    General: Skin is warm and dry.  Neurological:     Mental Status: She is alert.  Psychiatric:        Mood and Affect: Mood normal.        Behavior: Behavior normal.     UC Treatments / Results  Labs (all labs ordered are listed, but only abnormal results are displayed) Labs Reviewed  NOVEL CORONAVIRUS, NAA    EKG   Radiology No results found.  Procedures Procedures (including critical care time)  Medications Ordered in UC Medications - No data to display  Initial Impression / Assessment and Plan / UC Course  I have reviewed the triage vital  signs and the nursing notes.  Pertinent labs & imaging results that were available during my care of the patient were reviewed  by me and considered in my medical decision making (see chart for details).     Discussed that she likely has a viral upper respiratory infection and chest wall pain.  I have included treat her symptomatically with Mucinex and a prescription for Tessalon for her cough.  Because she has chest wall pain reliever prednisone for 5 days.  If she fails to improve with conservative treatment, and the COVID test was sent is negative then I have given her prescription for Zithromax to fill and use at her discretion Final Clinical Impressions(s) / UC Diagnoses   Final diagnoses:  Bronchitis  Acute upper respiratory infection  Acute chest wall pain     Discharge Instructions      Drink lots of fluids Use a humidifier if you have 1 Continue Mucinex DM 2 times a day Add Tessalon (benzonatate) 2 times a day.  This will help with cough Take prednisone 2 times a day for 5 days.  This helps with the tightness in your chest, chest pain. If you fail to improve on this therapy, take Zithromax as directed     ED Prescriptions     Medication Sig Dispense Auth. Provider   predniSONE (DELTASONE) 20 MG tablet Take 1 tablet (20 mg total) by mouth 2 (two) times daily with a meal. 10 tablet Raylene Everts, MD   benzonatate (TESSALON) 200 MG capsule Take 1 capsule (200 mg total) by mouth 2 (two) times daily as needed for cough. 20 capsule Raylene Everts, MD   azithromycin (ZITHROMAX Z-PAK) 250 MG tablet Take two pills today followed by one a day until gone 6 tablet Meda Coffee Jennette Banker, MD      PDMP not reviewed this encounter.   Raylene Everts, MD 10/21/20 [phone]

## 2020-10-21 NOTE — Discharge Instructions (Addendum)
Drink lots of fluids Use a humidifier if you have 1 Continue Mucinex DM 2 times a day Add Tessalon (benzonatate) 2 times a day.  This will help with cough Take prednisone 2 times a day for 5 days.  This helps with the tightness in your chest, chest pain. If you fail to improve on this therapy, take Zithromax as directed

## 2020-10-21 NOTE — ED Triage Notes (Addendum)
Pt presents to Urgent Care with c/o sore throat, cough, and nasal congestion x 2 days. Also reports that chest hurts/burns when she tries to take a deep breath. Has not done COVID test; has been vaccinated.

## 2020-10-22 LAB — SARS-COV-2, NAA 2 DAY TAT

## 2020-10-22 LAB — NOVEL CORONAVIRUS, NAA: SARS-CoV-2, NAA: NOT DETECTED

## 2020-12-01 ENCOUNTER — Encounter (HOSPITAL_BASED_OUTPATIENT_CLINIC_OR_DEPARTMENT_OTHER): Payer: Self-pay | Admitting: Obstetrics & Gynecology

## 2020-12-01 ENCOUNTER — Ambulatory Visit (INDEPENDENT_AMBULATORY_CARE_PROVIDER_SITE_OTHER): Payer: BC Managed Care – PPO | Admitting: Obstetrics & Gynecology

## 2020-12-01 ENCOUNTER — Other Ambulatory Visit: Payer: Self-pay

## 2020-12-01 VITALS — BP 128/73 | HR 76 | Ht 61.0 in | Wt 123.4 lb

## 2020-12-01 DIAGNOSIS — Z01419 Encounter for gynecological examination (general) (routine) without abnormal findings: Secondary | ICD-10-CM | POA: Diagnosis not present

## 2020-12-01 DIAGNOSIS — Z90710 Acquired absence of both cervix and uterus: Secondary | ICD-10-CM | POA: Diagnosis not present

## 2020-12-01 DIAGNOSIS — N200 Calculus of kidney: Secondary | ICD-10-CM

## 2020-12-01 NOTE — Progress Notes (Signed)
55 y.o. G14P2002 Married White or Caucasian female here for annual exam.  Doing well.    Had  CT scan this year with kidney stones.  Had endoscopy to assess gastric wall thickening.  Had endoscopy with Dr. Juel Burrow.  Reviewed in [place].  Patient's last menstrual period was 12/29/2015 (exact date).          Sexually active: Yes.    The current method of family planning is status post hysterectomy.    Exercising: Yes.     At least 30 minutes a day on elliptical  Smoker:  no  Health Maintenance: Pap:  11/11/2015 Negative History of abnormal Pap:  remote hx MMG:  01/24/2020 Addition images (02/19/2020) Colonoscopy:  cologuard 08/2018 BMD:   not indicated Screening Labs: 2019.  Declines.   reports that she has quit smoking. Her smoking use included cigarettes. She has never used smokeless tobacco. She reports current alcohol use of about 4.0 standard drinks per week. She reports that she does not use drugs.  Past Medical History:  Diagnosis Date   Abnormal Pap smear 2005   CIN 1 per records   Headache    Migraines   Kidney stones     Past Surgical History:  Procedure Laterality Date   CESAREAN SECTION  x2   CYSTOSCOPY N/A 01/13/2016   Procedure: CYSTOSCOPY;  Surgeon: Megan Salon, MD;  Location: [facility];  Service: Gynecology;  Laterality: N/A;   LAPAROSCOPIC BILATERAL SALPINGECTOMY Bilateral 01/13/2016   Procedure: LAPAROSCOPIC BILATERAL SALPINGECTOMY;  Surgeon: Megan Salon, MD;  Location: [facility];  Service: Gynecology;  Laterality: Bilateral;   LAPAROSCOPIC HYSTERECTOMY N/A 01/13/2016   Procedure: HYSTERECTOMY TOTAL LAPAROSCOPIC WITH PERITONEAL BIOPSY;  Surgeon: Megan Salon, MD;  Location: [facility];  Service: Gynecology;  Laterality: N/A;   LAPAROSCOPIC LYSIS OF ADHESIONS  01/13/2016   Procedure: LAPAROSCOPIC LYSIS OF ADHESIONS;  Surgeon: Megan Salon, MD;  Location: [facility];  Service: Gynecology;;   LITHOTRIPSY  7/16    Current Outpatient Medications  Medication Sig  Dispense Refill   Multiple Vitamins-Minerals (MULTIVITAMIN WITH MINERALS) tablet Take 1 tablet by mouth daily.     No current facility-administered medications for this visit.    Family History  Problem Relation Age of Onset   Kidney disease Mother    Diabetes Father 8   Heart disease Father    Diverticulosis Father     Review of Systems  All other systems reviewed and are negative.  Exam:   BP 128/73 (BP Location: Left Arm, Patient Position: Sitting, Cuff Size: Normal)   Pulse 76   Ht 5\' 1"  (1.549 m) Comment: reported  Wt 123 lb 6.4 oz (56 kg)   LMP 12/29/2015 (Exact Date)   BMI 23.32 kg/m   Height: 5\' 1"  (154.9 cm) (reported)  General appearance: alert, cooperative and appears stated age Head: Normocephalic, without obvious abnormality, atraumatic Neck: no adenopathy, supple, symmetrical, trachea midline and thyroid normal to inspection and palpation Lungs: clear to auscultation bilaterally Breasts: normal appearance, no masses or tenderness Heart: regular rate and rhythm Abdomen: soft, non-tender; bowel sounds normal; no masses,  no organomegaly Extremities: extremities normal, atraumatic, no cyanosis or edema Skin: Skin color, texture, turgor normal. No rashes or lesions Lymph nodes: Cervical, supraclavicular, and axillary nodes normal. No abnormal inguinal nodes palpated Neurologic: Grossly normal   Pelvic: External genitalia:  no lesions              Urethra:  normal appearing urethra with no masses,  tenderness or lesions              Bartholins and Skenes: normal                 Vagina: normal appearing vagina with normal color and no discharge, no lesions              Cervix: absent              Pap taken: No. Bimanual Exam:  Uterus:  uterus absent              Adnexa: normal adnexa and no mass, fullness, tenderness               Rectovaginal: Confirms               Anus:  normal sphincter tone, no lesions  Chaperone, Octaviano Batty, CMA, was present for  exam.  Assessment/Plan: 1. Well woman exam with routine gynecological exam - pap smear not indicated - MMG 01/2020 with follow up imaging that was negative - colonoscopy has been declined.  Cologuard negative 08/2018.  Pt aware needs to be repeated next year. - pt declines lab work.  Will try and do fasting lab work next year. - plan BMD close to age 43 - care gaps reviewed/updated  2. H/O: hysterectomy - TLH/bilateral salpingectomy/LOA, 2017  3. Nephrolithiasis

## 2021-03-11 ENCOUNTER — Other Ambulatory Visit: Payer: Self-pay

## 2021-03-11 ENCOUNTER — Ambulatory Visit (HOSPITAL_BASED_OUTPATIENT_CLINIC_OR_DEPARTMENT_OTHER): Payer: BC Managed Care – PPO | Admitting: Obstetrics & Gynecology

## 2021-03-11 ENCOUNTER — Encounter (HOSPITAL_BASED_OUTPATIENT_CLINIC_OR_DEPARTMENT_OTHER): Payer: Self-pay | Admitting: Obstetrics & Gynecology

## 2021-03-11 VITALS — BP 140/78 | HR 73 | Ht 61.0 in | Wt 130.2 lb

## 2021-03-11 DIAGNOSIS — Z1231 Encounter for screening mammogram for malignant neoplasm of breast: Secondary | ICD-10-CM

## 2021-03-11 DIAGNOSIS — N951 Menopausal and female climacteric states: Secondary | ICD-10-CM | POA: Diagnosis not present

## 2021-03-11 DIAGNOSIS — R5383 Other fatigue: Secondary | ICD-10-CM

## 2021-03-11 MED ORDER — FLUOXETINE HCL 10 MG PO TABS: ORAL_TABLET | ORAL | 1 refills | Status: DC

## 2021-03-11 NOTE — Progress Notes (Signed)
GYNECOLOGY  VISIT  CC:   menopausal symptoms  HPI: 56 y.o. G4P2002 Married White or Caucasian female here for discussion of symptoms with menopause.  She is having some hot flashes.  She does have some night sweats and does wake up at night.  She is finding that she is a lot more tearful.  She also reports she does not feel like she enjoys the things she normally enjoyed.  She is also frustrated with weight.  Exercises regularly and feels like she is struggling to maintain weight.  Lastly, she is having some sensation of mental fog and worried is this is indicative of anything more significant.  GYNECOLOGIC HISTORY: Patient's last menstrual period was 12/29/2015 (exact date). Contraception: PMP Menopausal hormone therapy: none  Patient Active Problem List   Diagnosis Date Noted   Insomnia 09/20/2016   Pelvic floor dysfunction 07/18/2016   PMDD (premenstrual dysphoric disorder) 07/18/2016   Nephrolithiasis 12/06/2014    Past Medical History:  Diagnosis Date   Abnormal Pap smear 2005   CIN 1 per records   Headache    Migraines   Kidney stones     Past Surgical History:  Procedure Laterality Date   CESAREAN SECTION  x2   CYSTOSCOPY N/A 01/13/2016   Procedure: CYSTOSCOPY;  Surgeon: Megan Salon, MD;  Location: [facility];  Service: Gynecology;  Laterality: N/A;   LAPAROSCOPIC BILATERAL SALPINGECTOMY Bilateral 01/13/2016   Procedure: LAPAROSCOPIC BILATERAL SALPINGECTOMY;  Surgeon: Megan Salon, MD;  Location: [facility];  Service: Gynecology;  Laterality: Bilateral;   LAPAROSCOPIC HYSTERECTOMY N/A 01/13/2016   Procedure: HYSTERECTOMY TOTAL LAPAROSCOPIC WITH PERITONEAL BIOPSY;  Surgeon: Megan Salon, MD;  Location: [facility];  Service: Gynecology;  Laterality: N/A;   LAPAROSCOPIC LYSIS OF ADHESIONS  01/13/2016   Procedure: LAPAROSCOPIC LYSIS OF ADHESIONS;  Surgeon: Megan Salon, MD;  Location: [facility];  Service: Gynecology;;   LITHOTRIPSY  7/16    MEDS:   Current Outpatient Medications on  File Prior to Visit  Medication Sig Dispense Refill   Multiple Vitamins-Minerals (MULTIVITAMIN WITH MINERALS) tablet Take 1 tablet by mouth daily.     No current facility-administered medications on file prior to visit.    ALLERGIES: Hydrocodone  Family History  Problem Relation Age of Onset   Kidney disease Mother    Diabetes Father 35   Heart disease Father    Diverticulosis Father     SH:  married, non smoker  Review of Systems  Respiratory: Negative.    Cardiovascular: Negative.   All other systems reviewed and are negative.  PHYSICAL EXAMINATION:    BP 140/78 (BP Location: Right Arm, Patient Position: Sitting, Cuff Size: Normal)    Pulse 73    Ht 5\' 1"  (1.549 m) Comment: reported   Wt 130 lb 3.2 oz (59.1 kg)    LMP 12/29/2015 (Exact Date)    BMI 24.60 kg/m     Physical Exam Constitutional:      Appearance: Normal appearance.  Neurological:     General: No focal deficit present.     Mental Status: She is alert.  Psychiatric:        Mood and Affect: Mood normal.        Behavior: Behavior normal.        Judgment: Judgment normal.    Assessment/Plan: 1. Vasomotor symptoms due to menopause - TSH - Estradiol - Follicle stimulating hormone - discussed with pt options for treatment including HRT vs SSRI therapy.  Risks, benefits reviewed.  As she  is having more mood symptoms, feel SSRI therapy is the right option to begin with.  Fluoxitine 5mg  daily x 4 days and then increasing to 10mg  daily ordered.  Will follow up with pt in 3-4 weeks.  2. Fatigue, unspecified type - TSH  3. Encounter for screening mammogram for malignant neoplasm of breast - this is due, order placed. - MM 3D SCREEN BREAST BILATERAL; Future

## 2021-03-12 ENCOUNTER — Encounter (HOSPITAL_BASED_OUTPATIENT_CLINIC_OR_DEPARTMENT_OTHER): Payer: Self-pay | Admitting: Obstetrics & Gynecology

## 2021-03-12 LAB — TSH: TSH: 1.95 u[IU]/mL (ref 0.450–4.500)

## 2021-03-12 LAB — ESTRADIOL: Estradiol: 92.3 pg/mL

## 2021-03-12 LAB — FOLLICLE STIMULATING HORMONE: FSH: 33.6 m[IU]/mL

## 2021-03-13 ENCOUNTER — Other Ambulatory Visit (HOSPITAL_BASED_OUTPATIENT_CLINIC_OR_DEPARTMENT_OTHER): Payer: Self-pay | Admitting: *Deleted

## 2021-03-19 ENCOUNTER — Ambulatory Visit (INDEPENDENT_AMBULATORY_CARE_PROVIDER_SITE_OTHER): Payer: BC Managed Care – PPO

## 2021-03-19 ENCOUNTER — Other Ambulatory Visit: Payer: Self-pay

## 2021-03-19 DIAGNOSIS — Z1231 Encounter for screening mammogram for malignant neoplasm of breast: Secondary | ICD-10-CM

## 2021-03-31 ENCOUNTER — Encounter (HOSPITAL_BASED_OUTPATIENT_CLINIC_OR_DEPARTMENT_OTHER): Payer: Self-pay | Admitting: Obstetrics & Gynecology

## 2021-04-01 ENCOUNTER — Other Ambulatory Visit (HOSPITAL_BASED_OUTPATIENT_CLINIC_OR_DEPARTMENT_OTHER): Payer: Self-pay | Admitting: *Deleted

## 2021-04-01 MED ORDER — FLUOXETINE HCL 20 MG PO CAPS: 20.0000 mg | ORAL_CAPSULE | Freq: Every day | ORAL | 0 refills | Status: DC

## 2021-04-01 NOTE — Progress Notes (Signed)
Rx sent to pharmacy for 20mg  capsules of prozac. Dr. Sabra Heck advised pt to increase dosage. Insurance covers capsules instead of tablets

## 2021-04-17 DIAGNOSIS — L814 Other melanin hyperpigmentation: Secondary | ICD-10-CM | POA: Diagnosis not present

## 2021-04-17 DIAGNOSIS — L821 Other seborrheic keratosis: Secondary | ICD-10-CM | POA: Diagnosis not present

## 2021-04-17 DIAGNOSIS — D239 Other benign neoplasm of skin, unspecified: Secondary | ICD-10-CM | POA: Diagnosis not present

## 2021-04-17 DIAGNOSIS — D229 Melanocytic nevi, unspecified: Secondary | ICD-10-CM | POA: Diagnosis not present

## 2021-04-30 ENCOUNTER — Ambulatory Visit (HOSPITAL_BASED_OUTPATIENT_CLINIC_OR_DEPARTMENT_OTHER): Payer: BC Managed Care – PPO | Admitting: Obstetrics & Gynecology

## 2021-05-01 ENCOUNTER — Other Ambulatory Visit (HOSPITAL_BASED_OUTPATIENT_CLINIC_OR_DEPARTMENT_OTHER): Payer: Self-pay | Admitting: Obstetrics & Gynecology

## 2021-05-04 MED ORDER — FLUOXETINE HCL 20 MG PO CAPS: 20.0000 mg | ORAL_CAPSULE | Freq: Every day | ORAL | 0 refills | Status: DC

## 2021-06-29 ENCOUNTER — Ambulatory Visit (HOSPITAL_BASED_OUTPATIENT_CLINIC_OR_DEPARTMENT_OTHER): Payer: BC Managed Care – PPO | Admitting: Obstetrics & Gynecology

## 2021-06-29 ENCOUNTER — Encounter (HOSPITAL_BASED_OUTPATIENT_CLINIC_OR_DEPARTMENT_OTHER): Payer: Self-pay | Admitting: Obstetrics & Gynecology

## 2021-08-10 ENCOUNTER — Emergency Department
Admission: EM | Admit: 2021-08-10 | Discharge: 2021-08-10 | Disposition: A | Discharge status: Home / Self Care | Payer: BC Managed Care – PPO | Source: Home / Self Care | Attending: Family Medicine | Admitting: Family Medicine

## 2021-08-10 ENCOUNTER — Encounter: Payer: Self-pay | Admitting: Emergency Medicine

## 2021-08-10 DIAGNOSIS — R509 Fever, unspecified: Secondary | ICD-10-CM

## 2021-08-10 LAB — POCT RAPID STREP A (OFFICE): Rapid Strep A Screen: NEGATIVE

## 2021-08-10 LAB — POCT URINALYSIS DIP (MANUAL ENTRY)
Bilirubin, UA: NEGATIVE
Glucose, UA: NEGATIVE mg/dL
Ketones, POC UA: NEGATIVE mg/dL
Leukocytes, UA: NEGATIVE
Nitrite, UA: NEGATIVE
Protein Ur, POC: NEGATIVE mg/dL
Spec Grav, UA: 1.025 (ref 1.010–1.025)
Urobilinogen, UA: 0.2 E.U./dL
pH, UA: 6 (ref 5.0–8.0)

## 2021-08-10 LAB — POCT INFLUENZA A/B
Influenza A, POC: NEGATIVE
Influenza B, POC: NEGATIVE

## 2021-08-10 NOTE — ED Triage Notes (Signed)
Pt states she has been running a fever since Friday. Tmax 100.8 but states it does not go down with tylenol. States she had some mild body aches Friday but has felt good since then but continues to fever.

## 2021-08-10 NOTE — ED Provider Notes (Signed)
[facility]    CSN: [id number] Arrival date & time: 08/10/21  1650      History   Chief Complaint Chief Complaint  Patient presents with   Fever    HPI Amy Curry is a 56 y.o. female.   HPI Patient states she had the sudden onset of fever and body aches fatigue on Friday.  She states she felt like she could not get off the couch.  She states that she is feeling largely better but is concerned because she still has a fever.  Her temperature has been from 100.4-101 over the last 4 days.  No cough cold runny nose.  No sore throat throat.  No allergies or sinus symptoms.  No ear pressure or pain.  No coughing or chest congestion.  No abdominal pain, nausea vomiting or diarrhea.  No urinary symptoms. She has done 2 COVID tests at home that were negative Past Medical History:  Diagnosis Date   Abnormal Pap smear 2005   CIN 1 per records   Headache    Migraines   Kidney stones     Patient Active Problem List   Diagnosis Date Noted   Insomnia 09/20/2016   Pelvic floor dysfunction 07/18/2016   PMDD (premenstrual dysphoric disorder) 07/18/2016   Nephrolithiasis 12/06/2014    Past Surgical History:  Procedure Laterality Date   CESAREAN SECTION  x2   CYSTOSCOPY N/A 01/13/2016   Procedure: CYSTOSCOPY;  Surgeon: Megan Salon, MD;  Location: [facility];  Service: Gynecology;  Laterality: N/A;   LAPAROSCOPIC BILATERAL SALPINGECTOMY Bilateral 01/13/2016   Procedure: LAPAROSCOPIC BILATERAL SALPINGECTOMY;  Surgeon: Megan Salon, MD;  Location: [facility];  Service: Gynecology;  Laterality: Bilateral;   LAPAROSCOPIC HYSTERECTOMY N/A 01/13/2016   Procedure: HYSTERECTOMY TOTAL LAPAROSCOPIC WITH PERITONEAL BIOPSY;  Surgeon: Megan Salon, MD;  Location: [facility];  Service: Gynecology;  Laterality: N/A;   LAPAROSCOPIC LYSIS OF ADHESIONS  01/13/2016   Procedure: LAPAROSCOPIC LYSIS OF ADHESIONS;  Surgeon: Megan Salon, MD;  Location: [facility];  Service: Gynecology;;   LITHOTRIPSY  7/16     OB History     Gravida  2   Para  2   Term  2   Preterm      AB      Living  2      SAB      IAB      Ectopic      Multiple      Live Births  2            Home Medications    Prior to Admission medications   Medication Sig Start Date End Date Taking? Authorizing Provider  Multiple Vitamins-Minerals (MULTIVITAMIN WITH MINERALS) tablet Take 1 tablet by mouth daily.    [provider]    Family History Family History  Problem Relation Age of Onset   Kidney disease Mother    Diabetes Father 16   Heart disease Father    Diverticulosis Father     Social History Social History   Tobacco Use   Smoking status: Former    Types: Cigarettes   Smokeless tobacco: Never  Vaping Use   Vaping Use: Never used  Substance Use Topics   Alcohol use: Yes    Alcohol/week: 4.0 standard drinks of alcohol    Types: 4 Standard drinks or equivalent per week   Drug use: No     Allergies   Hydrocodone   Review of Systems Review of Systems  See HPI Physical Exam Triage Vital Signs ED Triage Vitals  Enc Vitals Group     BP 08/10/21 1703 (!) 144/80     Pulse Rate 08/10/21 1703 76     Resp 08/10/21 1703 18     Temp 08/10/21 1703 98.4 F (36.9 C)     Temp Source 08/10/21 1703 Oral     SpO2 08/10/21 1703 100 %     Weight --      Height --      Head Circumference --      Peak Flow --      Pain Score 08/10/21 1704 0     Pain Loc --      Pain Edu? --      Excl. in [place]? --    No data found.  Updated Vital Signs BP (!) 144/80 (BP Location: Right Arm)   Pulse 76   Temp 98.4 F (36.9 C) (Oral)   Resp 18   LMP 12/29/2015 (Exact Date)   SpO2 100%      Physical Exam Constitutional:      General: She is not in acute distress.    Appearance: Normal appearance. She is well-developed. She is not ill-appearing.  HENT:     Head: Normocephalic and atraumatic.     Right Ear: Tympanic membrane and ear canal normal.     Left Ear: Tympanic membrane  and ear canal normal.     Nose: Nose normal. No congestion.     Mouth/Throat:     Mouth: Mucous membranes are moist.     Pharynx: Posterior oropharyngeal erythema present.     Comments: Tonsils very faintly erythematous.  No swelling or exudate Eyes:     Conjunctiva/sclera: Conjunctivae normal.     Pupils: Pupils are equal, round, and reactive to light.  Neck:     Comments: Nontender small anterior cervical nodes, mobile Cardiovascular:     Rate and Rhythm: Normal rate.     Heart sounds: Normal heart sounds.  Pulmonary:     Effort: Pulmonary effort is normal. No respiratory distress.     Breath sounds: Normal breath sounds. No wheezing or rhonchi.  Abdominal:     General: There is no distension.     Palpations: Abdomen is soft.     Tenderness: There is no abdominal tenderness.  Musculoskeletal:        General: Normal range of motion.     Cervical back: Normal range of motion.  Lymphadenopathy:     Cervical: Cervical adenopathy present.  Skin:    General: Skin is warm and dry.     Findings: No rash.  Neurological:     General: No focal deficit present.     Mental Status: She is alert.  Psychiatric:        Mood and Affect: Mood normal.        Behavior: Behavior normal.      UC Treatments / Results  Labs (all labs ordered are listed, but only abnormal results are displayed) Labs Reviewed  POCT URINALYSIS DIP (MANUAL ENTRY) - Abnormal; Notable for the following components:      Result Value   Blood, UA trace-intact (*)    All other components within normal limits  POCT INFLUENZA A/B  POCT RAPID STREP A (OFFICE)    EKG   Radiology No results found.  Procedures Procedures (including critical care time)  Medications Ordered in UC Medications - No data to display  Initial Impression / Assessment and Plan / UC  Course  I have reviewed the triage vital signs and the nursing notes.  Pertinent labs & imaging results that were available during my care of the  patient were reviewed by me and considered in my medical decision making (see chart for details).     Discussed fever.  Likely a virus.  No bacterial source identified that require antibiotics or alternate treatment.  Symptomatic care reviewed Final Clinical Impressions(s) / UC Diagnoses   Final diagnoses:  Febrile illness     Discharge Instructions      Make sure you are drinking plenty of fluids See your doctor if not improving in a couple days     ED Prescriptions   None    PDMP not reviewed this encounter.   Raylene Everts, MD 08/10/21 [phone]

## 2021-08-10 NOTE — Discharge Instructions (Signed)
Make sure you are drinking plenty of fluids See your doctor if not improving in a couple days

## 2021-11-17 DIAGNOSIS — K5790 Diverticulosis of intestine, part unspecified, without perforation or abscess without bleeding: Secondary | ICD-10-CM | POA: Diagnosis not present

## 2021-11-17 DIAGNOSIS — K3189 Other diseases of stomach and duodenum: Secondary | ICD-10-CM | POA: Diagnosis not present

## 2021-11-17 DIAGNOSIS — M5459 Other low back pain: Secondary | ICD-10-CM | POA: Diagnosis not present

## 2021-11-17 DIAGNOSIS — M25552 Pain in left hip: Secondary | ICD-10-CM | POA: Diagnosis not present

## 2021-11-17 DIAGNOSIS — Z888 Allergy status to other drugs, medicaments and biological substances status: Secondary | ICD-10-CM | POA: Diagnosis not present

## 2021-11-17 DIAGNOSIS — M545 Low back pain, unspecified: Secondary | ICD-10-CM | POA: Diagnosis not present

## 2021-11-17 DIAGNOSIS — Z79899 Other long term (current) drug therapy: Secondary | ICD-10-CM | POA: Diagnosis not present

## 2021-11-17 DIAGNOSIS — R1030 Lower abdominal pain, unspecified: Secondary | ICD-10-CM | POA: Diagnosis not present

## 2021-11-17 DIAGNOSIS — N200 Calculus of kidney: Secondary | ICD-10-CM | POA: Diagnosis not present

## 2021-11-17 DIAGNOSIS — R1032 Left lower quadrant pain: Secondary | ICD-10-CM | POA: Diagnosis not present

## 2021-11-17 DIAGNOSIS — Z90710 Acquired absence of both cervix and uterus: Secondary | ICD-10-CM | POA: Diagnosis not present

## 2021-11-17 DIAGNOSIS — Z87891 Personal history of nicotine dependence: Secondary | ICD-10-CM | POA: Diagnosis not present

## 2021-12-28 ENCOUNTER — Encounter (HOSPITAL_BASED_OUTPATIENT_CLINIC_OR_DEPARTMENT_OTHER): Payer: Self-pay | Admitting: Obstetrics & Gynecology

## 2021-12-28 ENCOUNTER — Ambulatory Visit (INDEPENDENT_AMBULATORY_CARE_PROVIDER_SITE_OTHER): Payer: BC Managed Care – PPO | Admitting: Obstetrics & Gynecology

## 2021-12-28 VITALS — BP 130/86 | HR 74 | Ht 61.0 in | Wt 130.4 lb

## 2021-12-28 DIAGNOSIS — Z01419 Encounter for gynecological examination (general) (routine) without abnormal findings: Secondary | ICD-10-CM

## 2021-12-28 DIAGNOSIS — Z90710 Acquired absence of both cervix and uterus: Secondary | ICD-10-CM | POA: Diagnosis not present

## 2021-12-28 DIAGNOSIS — Z1211 Encounter for screening for malignant neoplasm of colon: Secondary | ICD-10-CM

## 2021-12-28 NOTE — Progress Notes (Signed)
56 y.o. G65P2002 Married White or Caucasian female here for annual exam.  Doing well.  Denies vaginal bleeding.  Was seen in the ER at [facility] in [place].  She was having back pain and then had some pain radiation to the front.  She was concerned there were stones but CT was negative except for mild stool retention.    Patient's last menstrual period was 12/29/2015 (exact date).          Sexually active: Yes.    The current method of family planning is status post hysterectomy.    Exercising: Yes.     Smoker:  no  Health Maintenance: Pap:  not indicated History of abnormal Pap:  remote hx MMG:  03/2021 Colonoscopy:  2020 BMD:   not indicated Screening Labs: lab work done with ER visit in October.  Reviewed.   reports that she has quit smoking. Her smoking use included cigarettes. She has never used smokeless tobacco. She reports current alcohol use of about 4.0 standard drinks of alcohol per week. She reports that she does not use drugs.  Past Medical History:  Diagnosis Date   Abnormal Pap smear 2005   CIN 1 per records   Headache    Migraines   Kidney stones     Past Surgical History:  Procedure Laterality Date   CESAREAN SECTION  x2   CYSTOSCOPY N/A 01/13/2016   Procedure: CYSTOSCOPY;  Surgeon: Megan Salon, MD;  Location: [facility];  Service: Gynecology;  Laterality: N/A;   LAPAROSCOPIC BILATERAL SALPINGECTOMY Bilateral 01/13/2016   Procedure: LAPAROSCOPIC BILATERAL SALPINGECTOMY;  Surgeon: Megan Salon, MD;  Location: [facility];  Service: Gynecology;  Laterality: Bilateral;   LAPAROSCOPIC HYSTERECTOMY N/A 01/13/2016   Procedure: HYSTERECTOMY TOTAL LAPAROSCOPIC WITH PERITONEAL BIOPSY;  Surgeon: Megan Salon, MD;  Location: [facility];  Service: Gynecology;  Laterality: N/A;   LAPAROSCOPIC LYSIS OF ADHESIONS  01/13/2016   Procedure: LAPAROSCOPIC LYSIS OF ADHESIONS;  Surgeon: Megan Salon, MD;  Location: [facility];  Service: Gynecology;;   LITHOTRIPSY  7/16    Current Outpatient  Medications  Medication Sig Dispense Refill   Multiple Vitamins-Minerals (MULTIVITAMIN WITH MINERALS) tablet Take 1 tablet by mouth daily.     No current facility-administered medications for this visit.    Family History  Problem Relation Age of Onset   Kidney disease Mother    Diabetes Father 85   Heart disease Father    Diverticulosis Father     ROS: Constitutional: negative Genitourinary:negative  Exam:   BP 130/86 (BP Location: Left Arm, Patient Position: Sitting, Cuff Size: Normal)   Pulse 74   Ht '5\' 1"'$  (1.549 m) Comment: Reported  Wt 130 lb 6.4 oz (59.1 kg)   LMP 12/29/2015 (Exact Date)   BMI 24.64 kg/m   Height: '5\' 1"'$  (154.9 cm) (Reported)  General appearance: alert, cooperative and appears stated age Head: Normocephalic, without obvious abnormality, atraumatic Neck: no adenopathy, supple, symmetrical, trachea midline and thyroid normal to inspection and palpation Lungs: clear to auscultation bilaterally Breasts: normal appearance, no masses or tenderness Heart: regular rate and rhythm Abdomen: soft, non-tender; bowel sounds normal; no masses,  no organomegaly Extremities: extremities normal, atraumatic, no cyanosis or edema Skin: Skin color, texture, turgor normal. No rashes or lesions Lymph nodes: Cervical, supraclavicular, and axillary nodes normal. No abnormal inguinal nodes palpated Neurologic: Grossly normal   Pelvic: External genitalia:  no lesions              Urethra:  normal appearing  urethra with no masses, tenderness or lesions              Bartholins and Skenes: normal                 Vagina: normal appearing vagina with normal color and no discharge, no lesions              Cervix: absent              Pap taken: No. Bimanual Exam:  Uterus:  uterus absent              Adnexa: no mass, fullness, tenderness               Rectovaginal: Confirms               Anus:  normal sphincter tone, no lesions  Chaperone, Octaviano Batty, CMA, was present for  exam.  Assessment/Plan: 1. Well woman exam with routine gynecological exam - Pap smear not indicated - Mammogram 03/20/2021 - Colonoscopy declined but cologuard ordered - Bone mineral density not indicated - lab work done in October 2023.  We've discussed repeating the CBC and CMP next year.  Pt does not like blood work and does not desire any being done today. - vaccines reviewed/updated.   2. Colon cancer screening - Cologuard  3. H/O: hysterectomy

## 2022-01-10 DIAGNOSIS — Z1211 Encounter for screening for malignant neoplasm of colon: Secondary | ICD-10-CM | POA: Diagnosis not present

## 2022-01-19 LAB — COLOGUARD: COLOGUARD: NEGATIVE

## 2022-03-01 IMAGING — MG MM DIGITAL SCREENING BILAT W/ TOMO AND CAD
8 series · 9 of 24 positions shown · non-contrast
Comparison: Previous exam(s).

CLINICAL DATA: Screening.

EXAM:
DIGITAL SCREENING BILATERAL MAMMOGRAM WITH TOMOSYNTHESIS AND CAD
TECHNIQUE: Bilateral screening digital craniocaudal and mediolateral oblique
mammograms were obtained. Bilateral screening digital breast
tomosynthesis was performed. The images were evaluated with
computer-aided detection.

[L CC synth-2D]
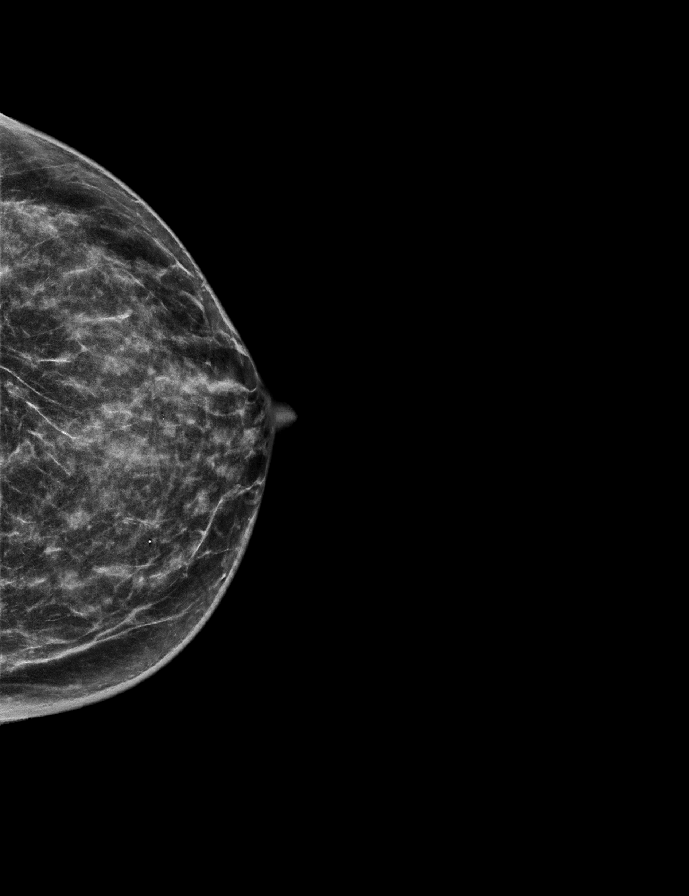

[R CC synth-2D]
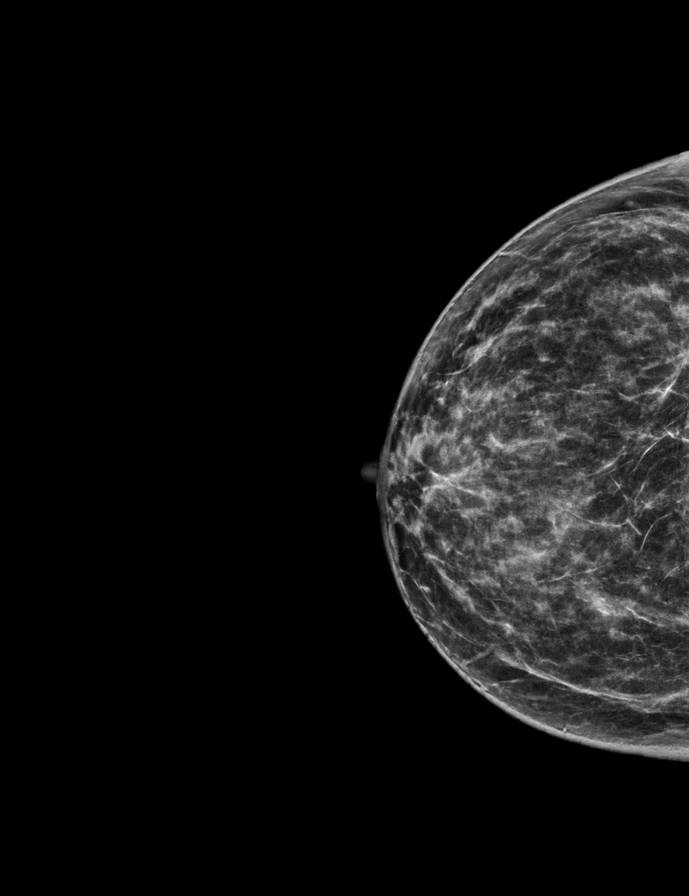

[R MLO synth-2D]
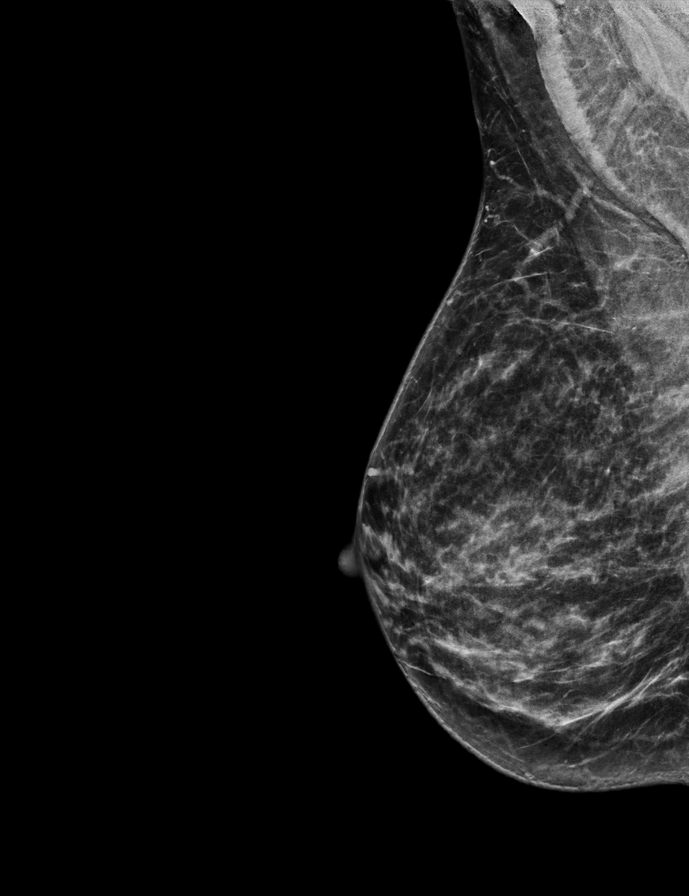

[L MLO synth-2D]
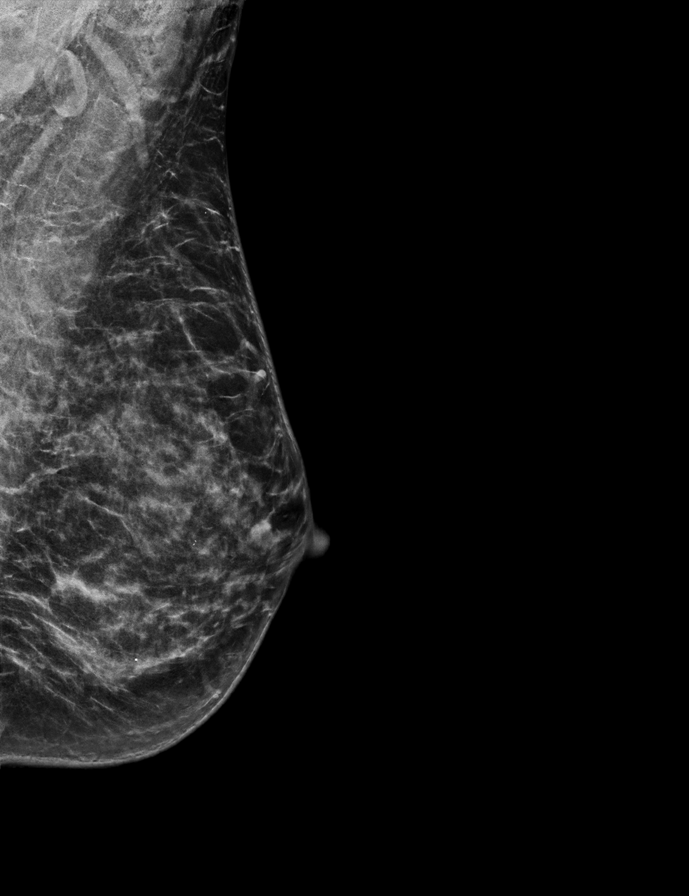

[L CC tomo · 2 of 56 frames shown]
[frame 19/56]
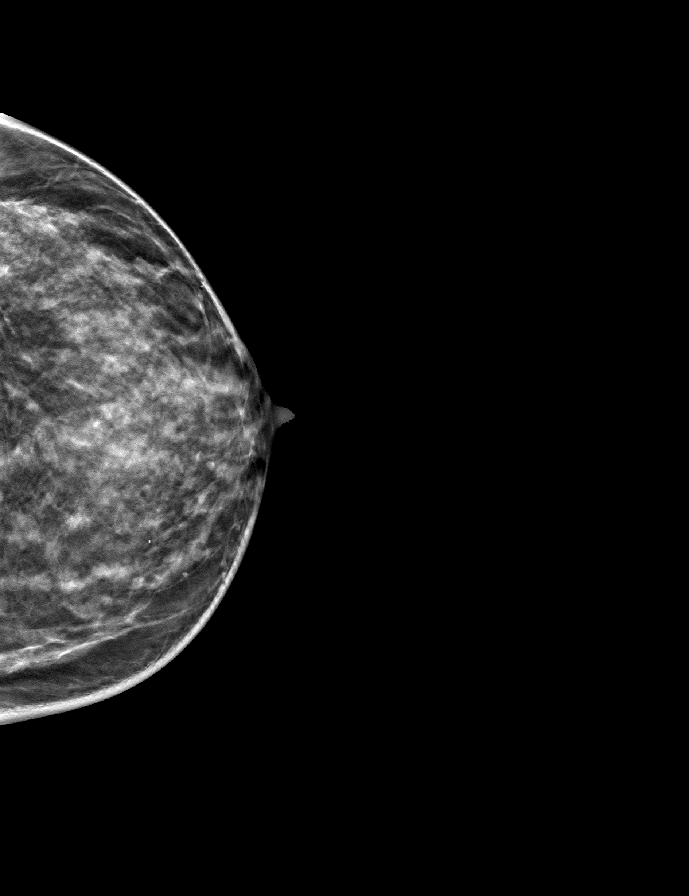
[frame 29/56]
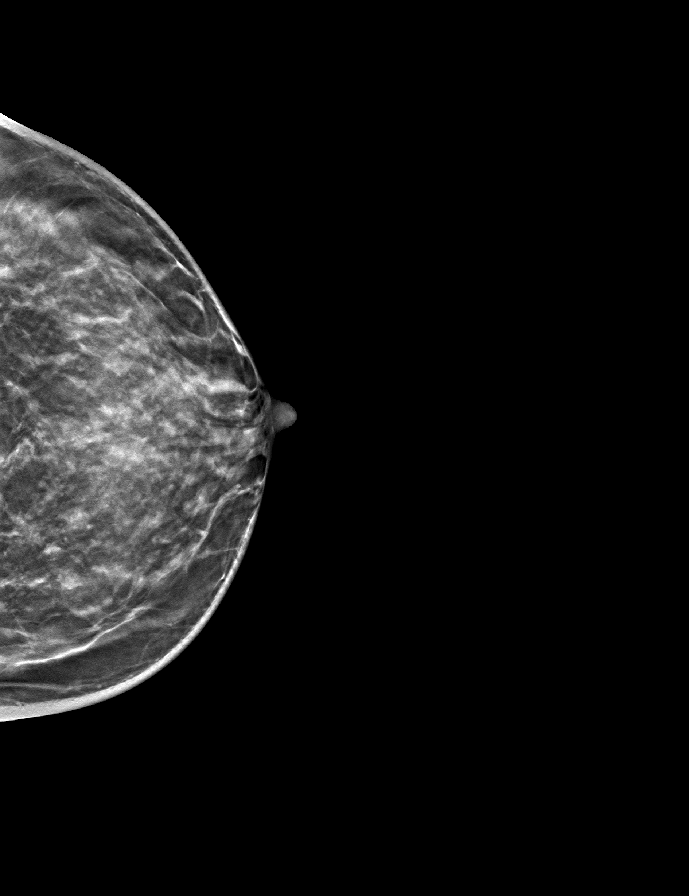

[L MLO tomo · tomo slice 29/57.0]
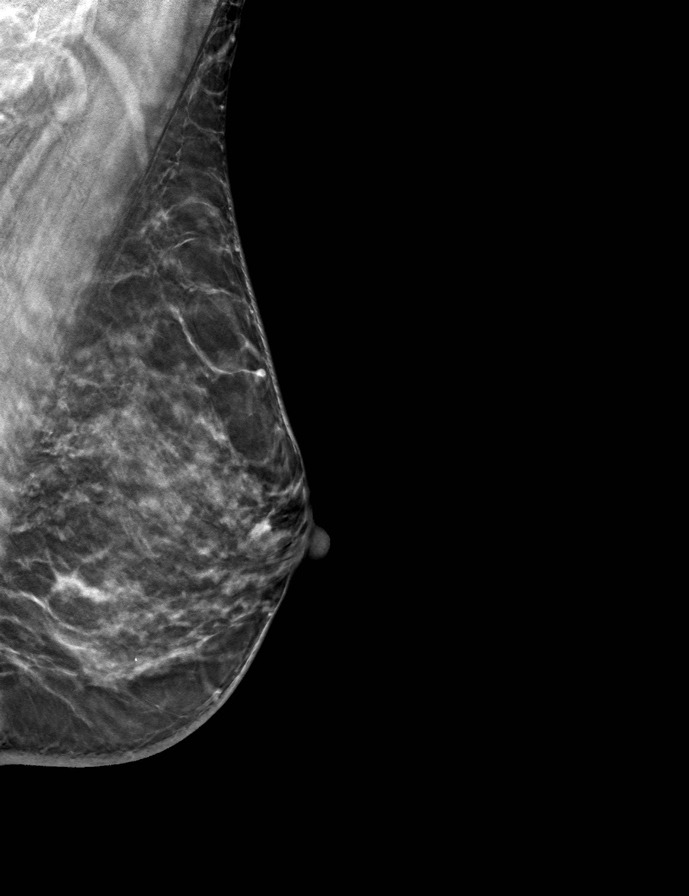

[R MLO tomo · tomo slice 28/55.0]
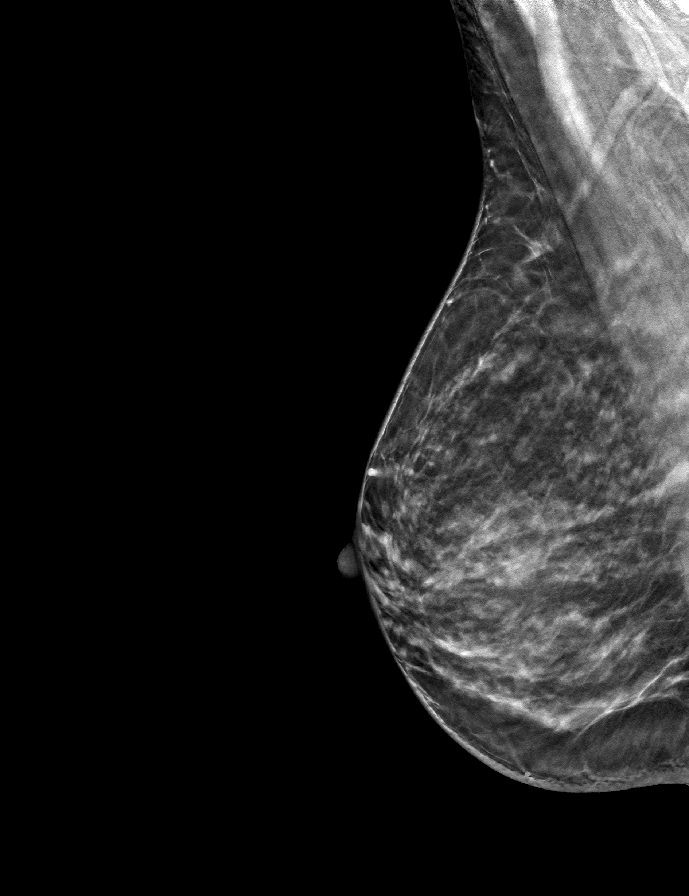

[R CC tomo · tomo slice 27/54.0]
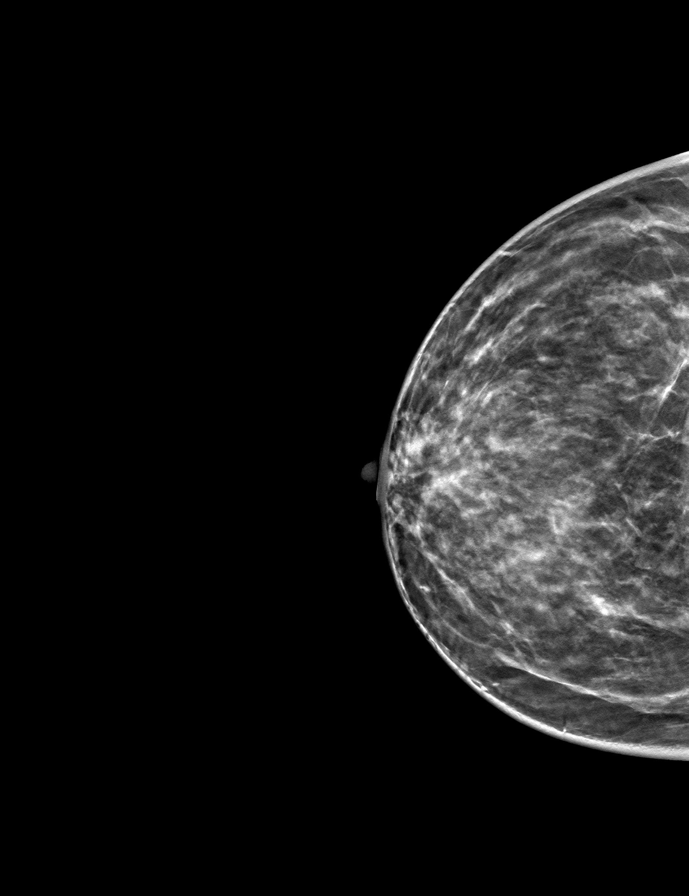

[9 of 24 positions shown; findings below may reference images not displayed]

ACR Breast Density Category c: The breast tissue is heterogeneously
dense, which may obscure small masses.
FINDINGS: There are no findings suspicious for malignancy.
IMPRESSION: No mammographic evidence of malignancy. A result letter of this
screening mammogram will be mailed directly to the patient.

RECOMMENDATION:
Screening mammogram in one year. (Code:Q3-W-BC3)

BI-RADS CATEGORY  1: Negative.

## 2022-03-17 ENCOUNTER — Ambulatory Visit (HOSPITAL_BASED_OUTPATIENT_CLINIC_OR_DEPARTMENT_OTHER): Payer: BC Managed Care – PPO

## 2022-03-17 ENCOUNTER — Other Ambulatory Visit (HOSPITAL_COMMUNITY)
Admission: RE | Admit: 2022-03-17 | Discharge: 2022-03-17 | Disposition: A | Discharge status: Home / Self Care | Payer: BC Managed Care – PPO | Source: Ambulatory Visit | Attending: Obstetrics & Gynecology | Admitting: Obstetrics & Gynecology

## 2022-03-17 DIAGNOSIS — N898 Other specified noninflammatory disorders of vagina: Secondary | ICD-10-CM

## 2022-03-17 NOTE — Progress Notes (Signed)
Patient came in today to do self aptima swab. She is complaining of vaginal itching and vaginal discharge. Patient states that she has completed 2 rounds of Monistat 7-day and the treatment has not touched the symptoms. tbw

## 2022-03-18 LAB — CERVICOVAGINAL ANCILLARY ONLY
Bacterial Vaginitis (gardnerella): NEGATIVE
Candida Glabrata: NEGATIVE
Candida Vaginitis: NEGATIVE
Comment: NEGATIVE
Comment: NEGATIVE
Comment: NEGATIVE

## 2022-03-26 ENCOUNTER — Other Ambulatory Visit: Payer: Self-pay | Admitting: Obstetrics & Gynecology

## 2022-03-26 DIAGNOSIS — Z1231 Encounter for screening mammogram for malignant neoplasm of breast: Secondary | ICD-10-CM

## 2022-03-29 ENCOUNTER — Encounter: Payer: Self-pay | Admitting: *Deleted

## 2022-04-01 ENCOUNTER — Ambulatory Visit (INDEPENDENT_AMBULATORY_CARE_PROVIDER_SITE_OTHER): Payer: BC Managed Care – PPO

## 2022-04-01 DIAGNOSIS — Z1231 Encounter for screening mammogram for malignant neoplasm of breast: Secondary | ICD-10-CM

## 2022-04-06 ENCOUNTER — Encounter (HOSPITAL_BASED_OUTPATIENT_CLINIC_OR_DEPARTMENT_OTHER): Payer: Self-pay | Admitting: Obstetrics & Gynecology

## 2022-04-06 ENCOUNTER — Ambulatory Visit (HOSPITAL_BASED_OUTPATIENT_CLINIC_OR_DEPARTMENT_OTHER): Payer: BC Managed Care – PPO | Admitting: Obstetrics & Gynecology

## 2022-04-06 ENCOUNTER — Other Ambulatory Visit (HOSPITAL_COMMUNITY)
Admission: RE | Admit: 2022-04-06 | Discharge: 2022-04-06 | Disposition: A | Discharge status: Home / Self Care | Payer: BC Managed Care – PPO | Source: Ambulatory Visit | Attending: Obstetrics & Gynecology | Admitting: Obstetrics & Gynecology

## 2022-04-06 VITALS — BP 123/65 | HR 78 | Ht 61.0 in | Wt 132.0 lb

## 2022-04-06 DIAGNOSIS — N9089 Other specified noninflammatory disorders of vulva and perineum: Secondary | ICD-10-CM

## 2022-04-06 DIAGNOSIS — R300 Dysuria: Secondary | ICD-10-CM | POA: Diagnosis not present

## 2022-04-06 DIAGNOSIS — N949 Unspecified condition associated with female genital organs and menstrual cycle: Secondary | ICD-10-CM | POA: Diagnosis not present

## 2022-04-06 LAB — POCT URINALYSIS DIPSTICK
Bilirubin, UA: NEGATIVE
Blood, UA: NEGATIVE
Glucose, UA: NEGATIVE
Ketones, UA: NEGATIVE
Leukocytes, UA: NEGATIVE
Nitrite, UA: NEGATIVE
Protein, UA: POSITIVE — AB
Spec Grav, UA: 1.025 (ref 1.010–1.025)
Urobilinogen, UA: 0.2 E.U./dL
pH, UA: 6 (ref 5.0–8.0)

## 2022-04-06 MED ORDER — TRIAMCINOLONE ACETONIDE 0.5 % EX OINT: 1.0000 | TOPICAL_OINTMENT | Freq: Three times a day (TID) | CUTANEOUS | 0 refills | Status: DC

## 2022-04-06 NOTE — Progress Notes (Signed)
GYNECOLOGY  VISIT  CC:   vaginal/vulvar burning  HPI: 57 y.o. G40P2002 Married White or Caucasian female here for complaint of vaginal/vulvar burning that has been present for about a month.  She has tried anti-fungals and topical steroids and vaseline.  Initially it felt itching but monistat x 2 courses didn't really help.  Denies vaginal discharge.  Having some burning with urination but this feels like it is her skin and not really bladder.  However, urine does have odor today.  Did swab on 03/17/2022 and this was negative for yeast and BV.   Denies vaginal bleeding.  Has been SA and this didn't make it worse but didn't feel great.  She used a lot of lubrication.     Past Medical History:  Diagnosis Date   Abnormal Pap smear 2005   CIN 1 per records   Headache    Migraines   Kidney stones     MEDS:   Current Outpatient Medications on File Prior to Visit  Medication Sig Dispense Refill   Multiple Vitamins-Minerals (MULTIVITAMIN WITH MINERALS) tablet Take 1 tablet by mouth daily.     No current facility-administered medications on file prior to visit.    ALLERGIES: Hydrocodone  SH:  married, non smoker  Review of Systems  Constitutional: Negative.   Genitourinary:        Vulvar burning    PHYSICAL EXAMINATION:    BP 123/65 (BP Location: Right Arm, Patient Position: Sitting, Cuff Size: Large)   Pulse 78   Ht '5\' 1"'$  (1.549 m) Comment: Reported  Wt 132 lb (59.9 kg)   LMP 12/29/2015 (Exact Date)   BMI 24.94 kg/m     General appearance: alert, cooperative and appears stated age Lymph:  no inguinal LAD noted  Pelvic: External genitalia:  inner labia erythema              Urethra:  normal appearing urethra with no masses, tenderness or lesions              Bartholins and Skenes: normal                 Vagina: atrophic appearing mucosa, vaginal discharge              Cervix: absent              Bimanual Exam:  Uterus:  uterus absent              Adnexa: normal  adnexa  Chaperone, Ezekiel Ina, RN, was present for exam.  Assessment/Plan: 1. Vaginal burning - POCT Urinalysis Dipstick - Cervicovaginal ancillary only( [place])  2. Dysuria - Urine Culture  3. Vulvar irritation - triamcinolone ointment (KENALOG) 0.5 %; Apply 1 Application topically 3 (three) times daily.  Dispense: 30 g; Refill: 0

## 2022-04-07 DIAGNOSIS — R300 Dysuria: Secondary | ICD-10-CM | POA: Diagnosis not present

## 2022-04-07 LAB — CERVICOVAGINAL ANCILLARY ONLY
Bacterial Vaginitis (gardnerella): NEGATIVE
Candida Glabrata: NEGATIVE
Candida Vaginitis: NEGATIVE
Comment: NEGATIVE
Comment: NEGATIVE
Comment: NEGATIVE

## 2022-04-08 ENCOUNTER — Encounter (HOSPITAL_BASED_OUTPATIENT_CLINIC_OR_DEPARTMENT_OTHER): Payer: Self-pay | Admitting: Obstetrics & Gynecology

## 2022-04-12 LAB — URINE CULTURE

## 2022-04-13 ENCOUNTER — Encounter (HOSPITAL_BASED_OUTPATIENT_CLINIC_OR_DEPARTMENT_OTHER): Payer: Self-pay | Admitting: Obstetrics & Gynecology

## 2022-04-14 ENCOUNTER — Other Ambulatory Visit (HOSPITAL_BASED_OUTPATIENT_CLINIC_OR_DEPARTMENT_OTHER): Payer: Self-pay | Admitting: Obstetrics & Gynecology

## 2022-04-14 MED ORDER — NITROFURANTOIN MONOHYD MACRO 100 MG PO CAPS: 100.0000 mg | ORAL_CAPSULE | Freq: Two times a day (BID) | ORAL | 0 refills | Status: DC

## 2022-04-24 ENCOUNTER — Encounter (HOSPITAL_BASED_OUTPATIENT_CLINIC_OR_DEPARTMENT_OTHER): Payer: Self-pay | Admitting: Obstetrics & Gynecology

## 2022-04-26 ENCOUNTER — Other Ambulatory Visit (HOSPITAL_BASED_OUTPATIENT_CLINIC_OR_DEPARTMENT_OTHER): Payer: Self-pay | Admitting: Obstetrics & Gynecology

## 2022-04-26 DIAGNOSIS — N9089 Other specified noninflammatory disorders of vulva and perineum: Secondary | ICD-10-CM

## 2022-04-26 MED ORDER — ESTRADIOL 0.1 MG/GM VA CREA: TOPICAL_CREAM | VAGINAL | 0 refills | Status: DC

## 2022-06-01 ENCOUNTER — Other Ambulatory Visit (HOSPITAL_BASED_OUTPATIENT_CLINIC_OR_DEPARTMENT_OTHER): Payer: Self-pay | Admitting: Obstetrics & Gynecology

## 2022-06-01 DIAGNOSIS — N9089 Other specified noninflammatory disorders of vulva and perineum: Secondary | ICD-10-CM

## 2022-06-03 ENCOUNTER — Telehealth (HOSPITAL_BASED_OUTPATIENT_CLINIC_OR_DEPARTMENT_OTHER): Payer: Self-pay | Admitting: *Deleted

## 2022-06-03 ENCOUNTER — Other Ambulatory Visit (HOSPITAL_BASED_OUTPATIENT_CLINIC_OR_DEPARTMENT_OTHER): Payer: Self-pay | Admitting: Obstetrics & Gynecology

## 2022-06-03 DIAGNOSIS — N9089 Other specified noninflammatory disorders of vulva and perineum: Secondary | ICD-10-CM

## 2022-06-03 MED ORDER — ESTRADIOL 0.1 MG/GM VA CREA: TOPICAL_CREAM | VAGINAL | 0 refills | Status: DC

## 2022-06-03 NOTE — Telephone Encounter (Signed)
Called pt to get updated on how estradiol vaginal cream was working for her, as we received a refill request from her pharmacy. She states that she has seen about a 90% improvement. She has problems with irritation when she works out.  Refill sent to pharmacy. Advised that I would let provider know of the update and get back to her with any recommendations.

## 2022-11-12 ENCOUNTER — Ambulatory Visit
Admission: EM | Admit: 2022-11-12 | Discharge: 2022-11-12 | Disposition: A | Discharge status: Home / Self Care | Payer: BC Managed Care – PPO

## 2022-11-12 ENCOUNTER — Encounter: Payer: Self-pay | Admitting: Emergency Medicine

## 2022-11-12 DIAGNOSIS — M722 Plantar fascial fibromatosis: Secondary | ICD-10-CM | POA: Diagnosis not present

## 2022-11-12 DIAGNOSIS — J069 Acute upper respiratory infection, unspecified: Secondary | ICD-10-CM | POA: Diagnosis not present

## 2022-11-12 NOTE — ED Provider Notes (Signed)
[facility]    CSN: [id number] Arrival date & time: 11/12/22  0854      History   Chief Complaint Chief Complaint  Patient presents with   Nasal Congestion    HPI LONDYNN SONODA is a 57 y.o. female.  3-day history of nasal congestion, runny nose, postnasal drip Using NyQuil Denies fever, chills, cough  Grandbaby was sick recently with fever and cold symptoms  Past Medical History:  Diagnosis Date   Abnormal Pap smear 2005   CIN 1 per records   Headache    Migraines   Kidney stones     Patient Active Problem List   Diagnosis Date Noted   Insomnia 09/20/2016   Pelvic floor dysfunction 07/18/2016   PMDD (premenstrual dysphoric disorder) 07/18/2016   Nephrolithiasis 12/06/2014    Past Surgical History:  Procedure Laterality Date   CESAREAN SECTION  x2   CYSTOSCOPY N/A 01/13/2016   Procedure: CYSTOSCOPY;  Surgeon: Jerene Bears, MD;  Location: [facility];  Service: Gynecology;  Laterality: N/A;   LAPAROSCOPIC BILATERAL SALPINGECTOMY Bilateral 01/13/2016   Procedure: LAPAROSCOPIC BILATERAL SALPINGECTOMY;  Surgeon: Jerene Bears, MD;  Location: [facility];  Service: Gynecology;  Laterality: Bilateral;   LAPAROSCOPIC HYSTERECTOMY N/A 01/13/2016   Procedure: HYSTERECTOMY TOTAL LAPAROSCOPIC WITH PERITONEAL BIOPSY;  Surgeon: Jerene Bears, MD;  Location: [facility];  Service: Gynecology;  Laterality: N/A;   LAPAROSCOPIC LYSIS OF ADHESIONS  01/13/2016   Procedure: LAPAROSCOPIC LYSIS OF ADHESIONS;  Surgeon: Jerene Bears, MD;  Location: [facility];  Service: Gynecology;;   LITHOTRIPSY  7/16    OB History     Gravida  2   Para  2   Term  2   Preterm      AB      Living  2      SAB      IAB      Ectopic      Multiple      Live Births  2            Home Medications    Prior to Admission medications   Medication Sig Start Date End Date Taking? Authorizing Provider  estradiol (ESTRACE) 0.1 MG/GM vaginal cream 1 gram vaginally and externally twice weekly  06/03/22   Jerene Bears, MD  Multiple Vitamins-Minerals (MULTIVITAMIN WITH MINERALS) tablet Take 1 tablet by mouth daily.    [provider]  triamcinolone ointment (KENALOG) 0.5 % Apply 1 Application topically 3 (three) times daily. 04/06/22   Jerene Bears, MD    Family History Family History  Problem Relation Age of Onset   Kidney disease Mother    Diabetes Father 33   Heart disease Father    Diverticulosis Father     Social History Social History   Tobacco Use   Smoking status: Former    Types: Cigarettes   Smokeless tobacco: Never  Vaping Use   Vaping status: Never Used  Substance Use Topics   Alcohol use: Yes    Alcohol/week: 4.0 standard drinks of alcohol    Types: 4 Standard drinks or equivalent per week   Drug use: No     Allergies   Hydrocodone   Review of Systems Review of Systems Per HPI  Physical Exam Triage Vital Signs ED Triage Vitals [11/12/22 0914]  Encounter Vitals Group     BP (!) 153/80     Systolic BP Percentile      Diastolic BP Percentile  Pulse Rate 78     Resp 16     Temp 98.3 F (36.8 C)     Temp Source Temporal     SpO2 100 %     Weight      Height      Head Circumference      Peak Flow      Pain Score 0     Pain Loc      Pain Education      Exclude from Growth Chart    No data found.  Updated Vital Signs BP (!) 153/80 (BP Location: Right Arm)   Pulse 78   Temp 98.3 F (36.8 C) (Temporal)   Resp 16   LMP 12/29/2015 (Exact Date)   SpO2 100%    Physical Exam Vitals and nursing note reviewed.  Constitutional:      Appearance: She is not ill-appearing.  HENT:     Right Ear: Tympanic membrane and ear canal normal.     Left Ear: Tympanic membrane and ear canal normal.     Nose: Rhinorrhea (mild, clear) present.     Mouth/Throat:     Mouth: Mucous membranes are moist.     Pharynx: Oropharynx is clear. No posterior oropharyngeal erythema.     Comments: PND Eyes:     Conjunctiva/sclera: Conjunctivae  normal.  Cardiovascular:     Rate and Rhythm: Normal rate and regular rhythm.     Heart sounds: Normal heart sounds.  Pulmonary:     Effort: Pulmonary effort is normal.     Breath sounds: Normal breath sounds.     Comments: clear Musculoskeletal:     Cervical back: Normal range of motion.  Skin:    General: Skin is warm and dry.  Neurological:     Mental Status: She is alert and oriented to person, place, and time.     UC Treatments / Results  Labs (all labs ordered are listed, but only abnormal results are displayed) Labs Reviewed - No data to display  EKG   Radiology No results found.  Procedures Procedures (including critical care time)  Medications Ordered in UC Medications - No data to display  Initial Impression / Assessment and Plan / UC Course  I have reviewed the triage vital signs and the nursing notes.  Pertinent labs & imaging results that were available during my care of the patient were reviewed by me and considered in my medical decision making (see chart for details).  Afebrile and well appearing.  Discussed likely viral etiology vs allergic Symptomatic care at home. Advised mucinex, allergy med, nasal spray, increase fluids. Other OTC meds as needed. Patient is agreeable to plan, no questions at this time  Final Clinical Impressions(s) / UC Diagnoses   Final diagnoses:  Viral upper respiratory tract infection     Discharge Instructions      I recommend once daily allergy medicine such as cetirizine or fexofenadine (Zyrtec or Allegra) Once daily nasal spray such as Flonase  Plain guaifenesin (brand name is Mucinex).  You can take this twice daily.  It works best if you are very hydrated so drink lots of fluids!  Benadryl at night can be helpful to dry up the sinuses and help you sleep.  Prop yourself up as well  Please return if needed    ED Prescriptions   None    PDMP not reviewed this encounter.   Marlow Baars, [place] 11/12/22  1111

## 2022-11-12 NOTE — Discharge Instructions (Addendum)
I recommend once daily allergy medicine such as cetirizine or fexofenadine (Zyrtec or Allegra) Once daily nasal spray such as Flonase  Plain guaifenesin (brand name is Mucinex).  You can take this twice daily.  It works best if you are very hydrated so drink lots of fluids!  Benadryl at night can be helpful to dry up the sinuses and help you sleep.  Prop yourself up as well  Please return if needed

## 2022-11-12 NOTE — ED Triage Notes (Signed)
Pt c/o nasal congestion, post nasal drip and cough since Tuesday. She has tried OTC cold meds with no relief. Afebrile.

## 2023-01-17 ENCOUNTER — Encounter (HOSPITAL_BASED_OUTPATIENT_CLINIC_OR_DEPARTMENT_OTHER): Payer: Self-pay | Admitting: Obstetrics & Gynecology

## 2023-01-17 ENCOUNTER — Ambulatory Visit (HOSPITAL_BASED_OUTPATIENT_CLINIC_OR_DEPARTMENT_OTHER): Payer: BC Managed Care – PPO | Admitting: Obstetrics & Gynecology

## 2023-01-17 VITALS — BP 137/84 | HR 81 | Ht 61.0 in | Wt 140.6 lb

## 2023-01-17 DIAGNOSIS — Z01419 Encounter for gynecological examination (general) (routine) without abnormal findings: Secondary | ICD-10-CM | POA: Diagnosis not present

## 2023-01-17 DIAGNOSIS — Z0000 Encounter for general adult medical examination without abnormal findings: Secondary | ICD-10-CM

## 2023-01-17 DIAGNOSIS — N952 Postmenopausal atrophic vaginitis: Secondary | ICD-10-CM | POA: Diagnosis not present

## 2023-01-17 MED ORDER — ESTRADIOL 0.1 MG/GM VA CREA: TOPICAL_CREAM | VAGINAL | 0 refills | Status: DC

## 2023-01-17 NOTE — Progress Notes (Unsigned)
57 y.o. G73P2002 Married White or Caucasian female here for annual exam.  Doing well except plantar fasciitis.  Has been on steroids.  This didn't help at all.  Discussed options.    Vaginal estrogen cream has really helped.  Isn't needing right now but would like RF.  Patient's last menstrual period was 12/29/2015 (exact date).          Sexually active: Yes.     Smoker:  no  Health Maintenance: Pap:  not indicated History of abnormal Pap:  remote hx MMG:  04/01/2022 Colonoscopy:  01/2022 BMD:   not indicated at this point Screening Labs: placed   reports that she has quit smoking. Her smoking use included cigarettes. She has never used smokeless tobacco. She reports current alcohol use of about 4.0 standard drinks of alcohol per week. She reports that she does not use drugs.  Past Medical History:  Diagnosis Date   Abnormal Pap smear 2005   CIN 1 per records   Headache    Migraines   Kidney stones     Past Surgical History:  Procedure Laterality Date   CESAREAN SECTION  x2   CYSTOSCOPY N/A 01/13/2016   Procedure: CYSTOSCOPY;  Surgeon: Jerene Bears, MD;  Location: [facility];  Service: Gynecology;  Laterality: N/A;   LAPAROSCOPIC BILATERAL SALPINGECTOMY Bilateral 01/13/2016   Procedure: LAPAROSCOPIC BILATERAL SALPINGECTOMY;  Surgeon: Jerene Bears, MD;  Location: [facility];  Service: Gynecology;  Laterality: Bilateral;   LAPAROSCOPIC HYSTERECTOMY N/A 01/13/2016   Procedure: HYSTERECTOMY TOTAL LAPAROSCOPIC WITH PERITONEAL BIOPSY;  Surgeon: Jerene Bears, MD;  Location: [facility];  Service: Gynecology;  Laterality: N/A;   LAPAROSCOPIC LYSIS OF ADHESIONS  01/13/2016   Procedure: LAPAROSCOPIC LYSIS OF ADHESIONS;  Surgeon: Jerene Bears, MD;  Location: [facility];  Service: Gynecology;;   LITHOTRIPSY  7/16    Current Outpatient Medications  Medication Sig Dispense Refill   estradiol (ESTRACE) 0.1 MG/GM vaginal cream 1 gram vaginally and externally twice weekly 42.5 g 0   Multiple  Vitamins-Minerals (MULTIVITAMIN WITH MINERALS) tablet Take 1 tablet by mouth daily.     triamcinolone ointment (KENALOG) 0.5 % Apply 1 Application topically 3 (three) times daily. 30 g 0   No current facility-administered medications for this visit.    Family History  Problem Relation Age of Onset   Kidney disease Mother    Diabetes Father 36   Heart disease Father    Diverticulosis Father     ROS: Constitutional: negative Genitourinary:negative  Exam:   BP 137/84 (BP Location: Right Arm, Patient Position: Sitting, Cuff Size: Normal)   Pulse 81   Ht 5\' 1"  (1.549 m)   Wt 140 lb 9.6 oz (63.8 kg)   LMP 12/29/2015 (Exact Date)   BMI 26.57 kg/m   Height: 5\' 1"  (154.9 cm)  General appearance: alert, cooperative and appears stated age Head: Normocephalic, without obvious abnormality, atraumatic Neck: no adenopathy, supple, symmetrical, trachea midline and thyroid normal to inspection and palpation Lungs: clear to auscultation bilaterally Breasts: normal appearance, no masses or tenderness Heart: regular rate and rhythm Abdomen: soft, non-tender; bowel sounds normal; no masses,  no organomegaly Extremities: extremities normal, atraumatic, no cyanosis or edema Skin: Skin color, texture, turgor normal. No rashes or lesions Lymph nodes: Cervical, supraclavicular, and axillary nodes normal. No abnormal inguinal nodes palpated Neurologic: Grossly normal   Pelvic: External genitalia:  no lesions              Urethra:  normal appearing urethra with  no masses, tenderness or lesions              Bartholins and Skenes: normal                 Vagina: normal appearing vagina with normal color and no discharge, no lesions              Cervix: absent              Pap taken: No. Bimanual Exam:  Uterus:  uterus absent              Adnexa: no mass, fullness, tenderness               Rectovaginal: Confirms               Anus:  normal sphincter tone, no lesions  Chaperone, Ina Homes, CMA,  was present for exam.  Assessment/Plan:

## 2023-01-27 ENCOUNTER — Other Ambulatory Visit: Payer: Self-pay | Admitting: Obstetrics & Gynecology

## 2023-01-27 DIAGNOSIS — M722 Plantar fascial fibromatosis: Secondary | ICD-10-CM | POA: Diagnosis not present

## 2023-01-27 DIAGNOSIS — Z0000 Encounter for general adult medical examination without abnormal findings: Secondary | ICD-10-CM | POA: Diagnosis not present

## 2023-01-28 LAB — COMPREHENSIVE METABOLIC PANEL
ALT: 18 [IU]/L (ref 0–32)
AST: 16 [IU]/L (ref 0–40)
Albumin: 4.2 g/dL (ref 3.8–4.9)
Alkaline Phosphatase: 106 [IU]/L (ref 44–121)
BUN/Creatinine Ratio: 16 (ref 9–23)
BUN: 11 mg/dL (ref 6–24)
Bilirubin Total: 0.6 mg/dL (ref 0.0–1.2)
CO2: 21 mmol/L (ref 20–29)
Calcium: 9.8 mg/dL (ref 8.7–10.2)
Chloride: 106 mmol/L (ref 96–106)
Creatinine, Ser: 0.7 mg/dL (ref 0.57–1.00)
Globulin, Total: 2.5 g/dL (ref 1.5–4.5)
Glucose: 96 mg/dL (ref 70–99)
Potassium: 5.4 mmol/L — ABNORMAL HIGH (ref 3.5–5.2)
Sodium: 142 mmol/L (ref 134–144)
Total Protein: 6.7 g/dL (ref 6.0–8.5)
eGFR: 103 mL/min/{1.73_m2} (ref >59)

## 2023-01-28 LAB — LIPID PANEL
Chol/HDL Ratio: 3 {ratio} (ref 0.0–4.4)
Cholesterol, Total: 208 mg/dL — ABNORMAL HIGH (ref 100–199)
HDL: 70 mg/dL (ref >39)
LDL Chol Calc (NIH): 118 mg/dL — ABNORMAL HIGH (ref 0–99)
Triglycerides: 114 mg/dL (ref 0–149)
VLDL Cholesterol Cal: 20 mg/dL (ref 5–40)

## 2023-01-28 LAB — HEMOGLOBIN A1C
Est. average glucose Bld gHb Est-mCnc: 108 mg/dL
Hgb A1c MFr Bld: 5.4 % (ref 4.8–5.6)

## 2023-01-28 LAB — TSH: TSH: 1.22 u[IU]/mL (ref 0.450–4.500)

## 2023-02-21 ENCOUNTER — Encounter (HOSPITAL_BASED_OUTPATIENT_CLINIC_OR_DEPARTMENT_OTHER): Payer: Self-pay | Admitting: Obstetrics & Gynecology

## 2023-05-02 ENCOUNTER — Other Ambulatory Visit: Payer: Self-pay | Admitting: Obstetrics & Gynecology

## 2023-05-02 DIAGNOSIS — Z1231 Encounter for screening mammogram for malignant neoplasm of breast: Secondary | ICD-10-CM

## 2023-05-16 ENCOUNTER — Other Ambulatory Visit (HOSPITAL_BASED_OUTPATIENT_CLINIC_OR_DEPARTMENT_OTHER): Payer: Self-pay | Admitting: Obstetrics & Gynecology

## 2023-05-16 DIAGNOSIS — N952 Postmenopausal atrophic vaginitis: Secondary | ICD-10-CM

## 2023-05-16 MED ORDER — ESTRADIOL 0.1 MG/GM VA CREA: TOPICAL_CREAM | VAGINAL | 0 refills | Status: AC

## 2023-06-09 ENCOUNTER — Ambulatory Visit

## 2023-06-09 DIAGNOSIS — Z1231 Encounter for screening mammogram for malignant neoplasm of breast: Secondary | ICD-10-CM

## 2023-06-15 ENCOUNTER — Other Ambulatory Visit: Payer: Self-pay | Admitting: Obstetrics & Gynecology

## 2023-06-15 DIAGNOSIS — R928 Other abnormal and inconclusive findings on diagnostic imaging of breast: Secondary | ICD-10-CM

## 2023-06-22 ENCOUNTER — Ambulatory Visit (HOSPITAL_BASED_OUTPATIENT_CLINIC_OR_DEPARTMENT_OTHER): Payer: Self-pay | Admitting: Obstetrics & Gynecology

## 2023-06-29 ENCOUNTER — Ambulatory Visit
Admission: RE | Admit: 2023-06-29 | Discharge: 2023-06-29 | Disposition: A | Discharge status: Home / Self Care | Source: Ambulatory Visit | Attending: Obstetrics & Gynecology | Admitting: Obstetrics & Gynecology

## 2023-06-29 DIAGNOSIS — N6313 Unspecified lump in the right breast, lower outer quadrant: Secondary | ICD-10-CM | POA: Diagnosis not present

## 2023-06-29 DIAGNOSIS — R922 Inconclusive mammogram: Secondary | ICD-10-CM | POA: Diagnosis not present

## 2023-06-29 DIAGNOSIS — R928 Other abnormal and inconclusive findings on diagnostic imaging of breast: Secondary | ICD-10-CM

## 2023-07-05 ENCOUNTER — Ambulatory Visit (HOSPITAL_BASED_OUTPATIENT_CLINIC_OR_DEPARTMENT_OTHER): Admitting: Obstetrics & Gynecology

## 2023-07-05 ENCOUNTER — Encounter (HOSPITAL_BASED_OUTPATIENT_CLINIC_OR_DEPARTMENT_OTHER): Payer: Self-pay | Admitting: Obstetrics & Gynecology

## 2023-07-05 VITALS — BP 130/78 | HR 74 | Ht 61.0 in | Wt 138.0 lb

## 2023-07-05 DIAGNOSIS — Z90710 Acquired absence of both cervix and uterus: Secondary | ICD-10-CM

## 2023-07-05 DIAGNOSIS — K5909 Other constipation: Secondary | ICD-10-CM

## 2023-07-05 DIAGNOSIS — R1032 Left lower quadrant pain: Secondary | ICD-10-CM | POA: Diagnosis not present

## 2023-07-05 NOTE — Progress Notes (Signed)
 GYNECOLOGY  VISIT  CC:   LLQ pain  HPI: 58 y.o. G74P2002 Married White or Caucasian female here for concerns about LLQ pain that has been going on for a couple of weeks.  She is having constipation issues.  She is taking acidophilus, pro biotics, smooth move tea.  She has purchased some suppositories but hasn't taken it yet.  Has been exercising with a stationary bike about every day for six month.  Denies any urinary changes.  No hematuria.    Denies vaginal bleeding.    H/o negative cologuard 01/2022.  Has never done a coloscopy.     Past Medical History:  Diagnosis Date   Abnormal Pap smear 2005   CIN 1 per records   Headache    Migraines   Kidney stones     MEDS:   Current Outpatient Medications on File Prior to Visit  Medication Sig Dispense Refill   estradiol  (ESTRACE ) 0.1 MG/GM vaginal cream 1 gram vaginally and externally twice weekly 42.5 g 0   Multiple Vitamins-Minerals (MULTIVITAMIN WITH MINERALS) tablet Take 1 tablet by mouth daily.     No current facility-administered medications on file prior to visit.    ALLERGIES: Hydrocodone  SH:  married, non smoker  Review of Systems  Constitutional: Negative.   Genitourinary:        LLQ pain    PHYSICAL EXAMINATION:    BP 130/78 Comment: manual  Pulse 74   Ht 5\' 1"  (1.549 m)   Wt 138 lb (62.6 kg)   LMP 12/29/2015 (Exact Date)   BMI 26.07 kg/m     General appearance: alert, cooperative and appears stated age Abdomen: soft, non-tender; bowel sounds normal; no masses,  no organomegaly Lymph:  no inguinal LAD noted  Pelvic: External genitalia:  no lesions              Urethra:  normal appearing urethra with no masses, tenderness or lesions              Bartholins and Skenes: normal                 Vagina: normal mucosa without prolapse or lesions              Cervix: absent              Bimanual Exam:  Uterus:  uterus absent              Adnexa: no masses but tender in left adnexal region  Chaperone, Myrtie Atkinson, CMA, was present for exam.  Assessment/Plan: 1. LLQ pain (Primary) - pt is going to return for ultrasound tomorrow - US  PELVIS TRANSVAGINAL NON-OB (TV ONLY); Future  2. H/O: hysterectomy   3. Other constipation

## 2023-07-06 ENCOUNTER — Ambulatory Visit (HOSPITAL_BASED_OUTPATIENT_CLINIC_OR_DEPARTMENT_OTHER): Admitting: Obstetrics & Gynecology

## 2023-07-06 ENCOUNTER — Ambulatory Visit (INDEPENDENT_AMBULATORY_CARE_PROVIDER_SITE_OTHER)

## 2023-07-06 VITALS — BP 141/91 | HR 75 | Ht 61.0 in | Wt 138.0 lb

## 2023-07-06 DIAGNOSIS — R1032 Left lower quadrant pain: Secondary | ICD-10-CM

## 2023-07-09 ENCOUNTER — Encounter (HOSPITAL_BASED_OUTPATIENT_CLINIC_OR_DEPARTMENT_OTHER): Payer: Self-pay | Admitting: Obstetrics & Gynecology

## 2023-07-09 NOTE — Progress Notes (Signed)
 GYNECOLOGY  VISIT  CC:   Discuss ultrasound results, LLQ pain  HPI: 58 y.o. G51P2002 Married White or Caucasian female here for discussion of ultrasound results.  Ultrasound obtained due to LLQ pain.  She was concerned there was an ovarian cyst.  Both ovaries are normal.  No cysts, masses, free fluid noted.    Discussed other causes of pelvic pain.  No urinary issues.  Possible GI issues like constipation or IBS discussed.  She is having more issues with upper abdominal bloating.  Probiotic use discussed.  She will consider.  Pt has not done a colonoscopy.  She has done cologuard.  Discussed she may need to consider this as well if pain continues. Will let me know if needs/wants referral.  Also, possible musculoskeletal causes reviewed.  She is going to monitor symptoms to see if needs to see any other provider.   Past Medical History:  Diagnosis Date   Abnormal Pap smear 2005   CIN 1 per records   Headache    Migraines   Kidney stones     MEDS:   Current Outpatient Medications on File Prior to Visit  Medication Sig Dispense Refill   estradiol  (ESTRACE ) 0.1 MG/GM vaginal cream 1 gram vaginally and externally twice weekly 42.5 g 0   Multiple Vitamins-Minerals (MULTIVITAMIN WITH MINERALS) tablet Take 1 tablet by mouth daily.     No current facility-administered medications on file prior to visit.    ALLERGIES: Hydrocodone  SH:  married, non smoker  Review of Systems  Constitutional: Negative.   Genitourinary: Negative.     PHYSICAL EXAMINATION:    BP (!) 141/91 (BP Location: Left Arm, Patient Position: Sitting)   Pulse 75   Ht 5\' 1"  (1.549 m)   Wt 138 lb (62.6 kg)   LMP 12/29/2015 (Exact Date)   BMI 26.07 kg/m      Physical Exam Constitutional:      Appearance: Normal appearance.  Neurological:     General: No focal deficit present.     Mental Status: She is alert.  Psychiatric:        Mood and Affect: Mood normal.     Assessment/Plan: 1. LLQ pain  (Primary) - discussed probiotic use and considering colonoscopy if continues.  Would need referral and I can place that if/when needed.  Voiced understanding.

## 2023-10-23 ENCOUNTER — Encounter: Payer: Self-pay | Admitting: Emergency Medicine

## 2023-10-23 ENCOUNTER — Ambulatory Visit
Admission: EM | Admit: 2023-10-23 | Discharge: 2023-10-23 | Disposition: A | Discharge status: Home / Self Care | Attending: Family Medicine | Admitting: Family Medicine

## 2023-10-23 DIAGNOSIS — U071 COVID-19: Secondary | ICD-10-CM

## 2023-10-23 LAB — POC SARS CORONAVIRUS 2 AG -  ED: SARS Coronavirus 2 Ag: POSITIVE — AB

## 2023-10-23 LAB — POC INFLUENZA A AND B ANTIGEN (URGENT CARE ONLY)
Influenza A Ag: NEGATIVE
Influenza B Ag: NEGATIVE

## 2023-10-23 MED ORDER — PAXLOVID (300/100) 20 X 150 MG & 10 X 100MG PO TBPK: 3.0000 | ORAL_TABLET | Freq: Two times a day (BID) | ORAL | 0 refills | Status: AC

## 2023-10-23 NOTE — ED Provider Notes (Signed)
 [facility]    CSN: [id number] Arrival date & time: 10/23/23  0953      History   Chief Complaint Chief Complaint  Patient presents with   Generalized Body Aches    HPI Amy Curry is a 58 y.o. female.   HPI  Patient has sore throat head congestion chest congestion and cough.  Fever and chills.  Temperature to 102.  Unknown exposure to illness.  Feels very tired.  No recent travel.  Past Medical History:  Diagnosis Date   Abnormal Pap smear 2005   CIN 1 per records   Headache    Migraines   Kidney stones     Patient Active Problem List   Diagnosis Date Noted   Insomnia 09/20/2016   Pelvic floor dysfunction 07/18/2016   PMDD (premenstrual dysphoric disorder) 07/18/2016   Nephrolithiasis 12/06/2014    Past Surgical History:  Procedure Laterality Date   CESAREAN SECTION  x2   CYSTOSCOPY N/A 01/13/2016   Procedure: CYSTOSCOPY;  Surgeon: Ronal GORMAN Pinal, MD;  Location: [facility];  Service: Gynecology;  Laterality: N/A;   LAPAROSCOPIC BILATERAL SALPINGECTOMY Bilateral 01/13/2016   Procedure: LAPAROSCOPIC BILATERAL SALPINGECTOMY;  Surgeon: Ronal GORMAN Pinal, MD;  Location: [facility];  Service: Gynecology;  Laterality: Bilateral;   LAPAROSCOPIC HYSTERECTOMY N/A 01/13/2016   Procedure: HYSTERECTOMY TOTAL LAPAROSCOPIC WITH PERITONEAL BIOPSY;  Surgeon: Ronal GORMAN Pinal, MD;  Location: [facility];  Service: Gynecology;  Laterality: N/A;   LAPAROSCOPIC LYSIS OF ADHESIONS  01/13/2016   Procedure: LAPAROSCOPIC LYSIS OF ADHESIONS;  Surgeon: Ronal GORMAN Pinal, MD;  Location: [facility];  Service: Gynecology;;   LITHOTRIPSY  7/16    OB History     Gravida  2   Para  2   Term  2   Preterm      AB      Living  2      SAB      IAB      Ectopic      Multiple      Live Births  2            Home Medications    Prior to Admission medications   Medication Sig Start Date End Date Taking? Authorizing Provider  estradiol  (ESTRACE ) 0.1 MG/GM vaginal cream 1 gram vaginally  and externally twice weekly 05/16/23  Yes Pinal Ronal GORMAN, MD  Multiple Vitamins-Minerals (MULTIVITAMIN WITH MINERALS) tablet Take 1 tablet by mouth daily.   Yes [provider]  nirmatrelvir/ritonavir (PAXLOVID , 300/100,) 20 x 150 MG & 10 x 100MG  TBPK Take 3 tablets by mouth 2 (two) times daily for 5 days. Patient GFR is 100  Take nirmatrelvir (150 mg) two tablets twice daily for 5 days and ritonavir (100 mg) one tablet twice daily for 5 days. 10/23/23 10/28/23 Yes Maranda Jamee Jacob, MD    Family History Family History  Problem Relation Age of Onset   Kidney disease Mother    Diabetes Father 70   Heart disease Father    Diverticulosis Father     Social History Social History   Tobacco Use   Smoking status: Former    Types: Cigarettes   Smokeless tobacco: Never  Vaping Use   Vaping status: Never Used  Substance Use Topics   Alcohol use: Yes    Alcohol/week: 4.0 standard drinks of alcohol    Types: 4 Standard drinks or equivalent per week   Drug use: No     Allergies   Hydrocodone   Review of  Systems Review of Systems See HPI  Physical Exam Triage Vital Signs ED Triage Vitals  Encounter Vitals Group     BP 10/23/23 1006 128/82     Girls Systolic BP Percentile --      Girls Diastolic BP Percentile --      Boys Systolic BP Percentile --      Boys Diastolic BP Percentile --      Pulse Rate 10/23/23 1006 95     Resp 10/23/23 1006 18     Temp 10/23/23 1006 (!) 100.9 F (38.3 C)     Temp Source 10/23/23 1006 Oral     SpO2 10/23/23 1006 94 %     Weight 10/23/23 1008 135 lb (61.2 kg)     Height 10/23/23 1008 5' 1 (1.549 m)     Head Circumference --      Peak Flow --      Pain Score 10/23/23 1008 8     Pain Loc --      Pain Education --      Exclude from Growth Chart --    No data found.  Updated Vital Signs BP 128/82 (BP Location: Right Arm)   Pulse 95   Temp (!) 100.9 F (38.3 C) (Oral)   Resp 18   Ht 5' 1 (1.549 m)   Wt 61.2 kg   LMP 12/29/2015  (Exact Date)   SpO2 94%   BMI 25.51 kg/m      Physical Exam Constitutional:      General: She is not in acute distress.    Appearance: She is well-developed. She is ill-appearing.  HENT:     Head: Normocephalic and atraumatic.     Right Ear: Tympanic membrane normal.     Left Ear: Tympanic membrane normal.     Nose: Nose normal.     Mouth/Throat:     Mouth: Mucous membranes are moist.     Pharynx: Posterior oropharyngeal erythema present.  Eyes:     Conjunctiva/sclera: Conjunctivae normal.     Pupils: Pupils are equal, round, and reactive to light.  Cardiovascular:     Rate and Rhythm: Normal rate and regular rhythm.     Heart sounds: Normal heart sounds.  Pulmonary:     Effort: Pulmonary effort is normal. No respiratory distress.     Breath sounds: Normal breath sounds.  Abdominal:     General: There is no distension.     Palpations: Abdomen is soft.  Musculoskeletal:        General: Normal range of motion.     Cervical back: Normal range of motion.  Skin:    General: Skin is warm and dry.  Neurological:     Mental Status: She is alert.      UC Treatments / Results  Labs (all labs ordered are listed, but only abnormal results are displayed) Labs Reviewed  POC SARS CORONAVIRUS 2 AG -  ED - Abnormal; Notable for the following components:      Result Value   SARS Coronavirus 2 Ag Positive (*)    All other components within normal limits  POC INFLUENZA A AND B ANTIGEN (URGENT CARE ONLY)    EKG   Radiology No results found.  Procedures Procedures (including critical care time)  Medications Ordered in UC Medications - No data to display  Initial Impression / Assessment and Plan / UC Course  I have reviewed the triage vital signs and the nursing notes.  Pertinent labs & imaging results that were  available during my care of the patient were reviewed by me and considered in my medical decision making (see chart for details).     Discussed COVID.  Home  treatment.  Quarantine.  Mask Final Clinical Impressions(s) / UC Diagnoses   Final diagnoses:  COVID-19     Discharge Instructions      May take Tylenol , ibuprofen , or naproxen  for the pain and fever Make sure you are drinking lots of fluids Take the Paxlovid  2 times a day for 5 days.  Take 2 doses today Call for problems   ED Prescriptions     Medication Sig Dispense Auth. Provider   nirmatrelvir/ritonavir (PAXLOVID , 300/100,) 20 x 150 MG & 10 x 100MG  TBPK Take 3 tablets by mouth 2 (two) times daily for 5 days. Patient GFR is 100  Take nirmatrelvir (150 mg) two tablets twice daily for 5 days and ritonavir (100 mg) one tablet twice daily for 5 days. 30 tablet Maranda Jamee Jacob, MD      PDMP not reviewed this encounter.   Maranda Jamee Jacob, MD 10/23/23 [phone]

## 2023-10-23 NOTE — ED Triage Notes (Signed)
 Patient c/o body aches, chills, cough, head and chest congestion x 2 days.  Fever this am of 102.  Patient is taking Ibuprofen  and Nyquil.

## 2023-10-23 NOTE — Discharge Instructions (Signed)
 May take Tylenol , ibuprofen , or naproxen  for the pain and fever Make sure you are drinking lots of fluids Take the Paxlovid  2 times a day for 5 days.  Take 2 doses today Call for problems
